# Patient Record
Sex: Female | Born: 1955 | Race: White | Hispanic: No | Marital: Married | Smoking: Never smoker
Health system: Southern US, Community
[De-identification: ages and names within clinical notes are randomized; demographics above are authoritative.]

## PROBLEM LIST (undated history)

## (undated) DIAGNOSIS — D369 Benign neoplasm, unspecified site: Secondary | ICD-10-CM

## (undated) DIAGNOSIS — IMO0002 Reserved for concepts with insufficient information to code with codable children: Secondary | ICD-10-CM

## (undated) DIAGNOSIS — E7800 Pure hypercholesterolemia, unspecified: Secondary | ICD-10-CM

## (undated) DIAGNOSIS — R87610 Atypical squamous cells of undetermined significance on cytologic smear of cervix (ASC-US): Secondary | ICD-10-CM

## (undated) DIAGNOSIS — C801 Malignant (primary) neoplasm, unspecified: Secondary | ICD-10-CM

## (undated) DIAGNOSIS — M8580 Other specified disorders of bone density and structure, unspecified site: Secondary | ICD-10-CM

## (undated) DIAGNOSIS — I10 Essential (primary) hypertension: Secondary | ICD-10-CM

## (undated) DIAGNOSIS — E079 Disorder of thyroid, unspecified: Secondary | ICD-10-CM

## (undated) DIAGNOSIS — Z8619 Personal history of other infectious and parasitic diseases: Secondary | ICD-10-CM

## (undated) HISTORY — PX: BREAST SURGERY: SHX581

## (undated) HISTORY — DX: Disorder of thyroid, unspecified: E07.9

## (undated) HISTORY — PX: OTHER SURGICAL HISTORY: SHX169

## (undated) HISTORY — DX: Pure hypercholesterolemia, unspecified: E78.00

## (undated) HISTORY — DX: Malignant (primary) neoplasm, unspecified: C80.1

## (undated) HISTORY — PX: THYROIDECTOMY, PARTIAL: SHX18

## (undated) HISTORY — PX: MOHS SURGERY: SUR867

## (undated) HISTORY — DX: Personal history of other infectious and parasitic diseases: Z86.19

## (undated) HISTORY — DX: Benign neoplasm, unspecified site: D36.9

## (undated) HISTORY — DX: Essential (primary) hypertension: I10

## (undated) HISTORY — DX: Other specified disorders of bone density and structure, unspecified site: M85.80

## (undated) HISTORY — PX: LAPAROSCOPIC CHOLECYSTECTOMY: SUR755

## (undated) HISTORY — DX: Reserved for concepts with insufficient information to code with codable children: IMO0002

## (undated) HISTORY — DX: Atypical squamous cells of undetermined significance on cytologic smear of cervix (ASC-US): R87.610

---

## 1998-11-14 ENCOUNTER — Other Ambulatory Visit: Admission: RE | Admit: 1998-11-14 | Discharge: 1998-11-14 | Payer: Self-pay | Admitting: Obstetrics and Gynecology

## 1999-04-16 ENCOUNTER — Ambulatory Visit (HOSPITAL_COMMUNITY): Admission: RE | Admit: 1999-04-16 | Discharge: 1999-04-16 | Payer: Self-pay | Admitting: Surgery

## 1999-04-16 ENCOUNTER — Encounter (INDEPENDENT_AMBULATORY_CARE_PROVIDER_SITE_OTHER): Payer: Self-pay | Admitting: *Deleted

## 2000-02-06 ENCOUNTER — Ambulatory Visit (HOSPITAL_COMMUNITY): Admission: RE | Admit: 2000-02-06 | Discharge: 2000-02-06 | Payer: Self-pay | Admitting: Family Medicine

## 2000-02-06 ENCOUNTER — Encounter: Payer: Self-pay | Admitting: Family Medicine

## 2000-02-17 ENCOUNTER — Encounter: Admission: RE | Admit: 2000-02-17 | Discharge: 2000-02-17 | Payer: Self-pay | Admitting: Family Medicine

## 2000-02-17 ENCOUNTER — Encounter: Payer: Self-pay | Admitting: Family Medicine

## 2000-03-09 ENCOUNTER — Ambulatory Visit (HOSPITAL_COMMUNITY): Admission: RE | Admit: 2000-03-09 | Discharge: 2000-03-09 | Payer: Self-pay | Admitting: Surgery

## 2000-03-09 ENCOUNTER — Encounter (INDEPENDENT_AMBULATORY_CARE_PROVIDER_SITE_OTHER): Payer: Self-pay | Admitting: Specialist

## 2000-03-09 ENCOUNTER — Encounter: Payer: Self-pay | Admitting: Surgery

## 2000-04-23 ENCOUNTER — Encounter (INDEPENDENT_AMBULATORY_CARE_PROVIDER_SITE_OTHER): Payer: Self-pay | Admitting: Specialist

## 2000-04-23 ENCOUNTER — Observation Stay (HOSPITAL_COMMUNITY): Admission: RE | Admit: 2000-04-23 | Discharge: 2000-04-24 | Payer: Self-pay | Admitting: Surgery

## 2001-03-19 ENCOUNTER — Other Ambulatory Visit: Admission: RE | Admit: 2001-03-19 | Discharge: 2001-03-19 | Payer: Self-pay | Admitting: Obstetrics and Gynecology

## 2002-02-17 ENCOUNTER — Other Ambulatory Visit: Admission: RE | Admit: 2002-02-17 | Discharge: 2002-02-17 | Payer: Self-pay | Admitting: Dermatology

## 2002-12-27 ENCOUNTER — Other Ambulatory Visit: Admission: RE | Admit: 2002-12-27 | Discharge: 2002-12-27 | Payer: Self-pay | Admitting: Obstetrics and Gynecology

## 2004-04-08 ENCOUNTER — Other Ambulatory Visit: Admission: RE | Admit: 2004-04-08 | Discharge: 2004-04-08 | Payer: Self-pay | Admitting: Obstetrics and Gynecology

## 2005-07-21 ENCOUNTER — Other Ambulatory Visit: Admission: RE | Admit: 2005-07-21 | Discharge: 2005-07-21 | Payer: Self-pay | Admitting: Obstetrics and Gynecology

## 2006-12-03 ENCOUNTER — Other Ambulatory Visit: Admission: RE | Admit: 2006-12-03 | Discharge: 2006-12-03 | Payer: Self-pay | Admitting: Obstetrics and Gynecology

## 2006-12-14 ENCOUNTER — Encounter: Admission: RE | Admit: 2006-12-14 | Discharge: 2006-12-14 | Payer: Self-pay | Admitting: Obstetrics and Gynecology

## 2008-02-03 ENCOUNTER — Other Ambulatory Visit: Admission: RE | Admit: 2008-02-03 | Discharge: 2008-02-03 | Payer: Self-pay | Admitting: Obstetrics and Gynecology

## 2009-02-05 ENCOUNTER — Other Ambulatory Visit: Admission: RE | Admit: 2009-02-05 | Discharge: 2009-02-05 | Payer: Self-pay | Admitting: Obstetrics and Gynecology

## 2009-02-05 ENCOUNTER — Ambulatory Visit: Payer: Self-pay | Admitting: Obstetrics and Gynecology

## 2009-02-05 ENCOUNTER — Encounter: Payer: Self-pay | Admitting: Obstetrics and Gynecology

## 2009-02-12 ENCOUNTER — Ambulatory Visit: Payer: Self-pay | Admitting: Obstetrics and Gynecology

## 2009-12-27 ENCOUNTER — Ambulatory Visit: Payer: Self-pay | Admitting: Obstetrics and Gynecology

## 2010-01-10 ENCOUNTER — Ambulatory Visit: Payer: Self-pay | Admitting: Obstetrics and Gynecology

## 2010-02-12 ENCOUNTER — Ambulatory Visit: Payer: Self-pay | Admitting: Obstetrics and Gynecology

## 2010-02-12 ENCOUNTER — Other Ambulatory Visit: Admission: RE | Admit: 2010-02-12 | Discharge: 2010-02-12 | Payer: Self-pay | Admitting: Obstetrics and Gynecology

## 2010-10-31 ENCOUNTER — Other Ambulatory Visit: Payer: Self-pay | Admitting: Orthopaedic Surgery

## 2010-10-31 ENCOUNTER — Ambulatory Visit
Admission: RE | Admit: 2010-10-31 | Discharge: 2010-10-31 | Disposition: A | Discharge status: Home / Self Care | Payer: Self-pay | Source: Ambulatory Visit | Attending: Orthopaedic Surgery | Admitting: Orthopaedic Surgery

## 2010-10-31 DIAGNOSIS — M542 Cervicalgia: Secondary | ICD-10-CM

## 2010-11-08 NOTE — Op Note (Signed)
[facility]  Patient:    Deborah, Sandoval                   MRN: [id number] Proc. Date: 04/23/00 Adm. Date:  [id number] Attending:  Abigail Miyamoto A                           Operative Report  PREOPERATIVE DIAGNOSIS:  Left thyroid mass.  POSTOPERATIVE DIAGNOSIS:  Left thyroid mass.  PROCEDURE:  Left thyroid lobectomy.  SURGEON:  Abigail Miyamoto, M.D.  ASSISTANT:  Angelia Mould. Derrell Lolling, M.D.  ANESTHESIA:  General endotracheal anesthesia.  ESTIMATED BLOOD LOSS:  Minimal.  INDICATIONS:  Deborah Sandoval is a 55 year old female who was found to have a nodule on the left lobe of her thyroid gland on routine physical examination. Fine needle aspiration showed a follicular component to this mass, and therefore left thyroid lobectomy was recommended.  FINDINGS:  The patient was found to have a very large nodule in the left thyroid lobe.  Frozen section revealed follicular cells but no capsular invasion or findings consistent with carcinoma.  DESCRIPTION OF PROCEDURE:  The patient was brought to the operating room and identified as Deborah Sandoval.  She was placed supine on the operating room table, and general anesthesia was induced.  Her neck was then prepped and draped in the usual sterile fashion.  Using the #10 blade, a transverse incision was made across the lower neck.  The incision was carried down through the platysma with the electrocautery.  Superior and anterior skin flaps were then created with cautery.  The midline was then opened vertically with the electrocautery.  The strap muscles were then dissected and retracted laterally, revealing the underlying left lobe of the thyroid.  The patient was found to have a very large nodule of the mid- and lower pole of the left thyroid lobe.  The lobe was then elevated out and dissection was used as we stayed right on the capsule.  The Kitner and right-angle clamps were used to identify this.  The  _____ vein was identified and clipped twice proximally and once distally and cut with the scissors.  Next, the upper pole vessels were taken down with the right-angle clamp, silk ties, and surgical clips. Again care was taken to stay right on the capsule of the thyroid.  The recurrent laryngeal nerve was identified during the dissection.  The inferior thyroid artery on the right side was identified and clipped and cut as well. Again, the lower pole vessels were also dissected free and clipped with surgical clips and transected as well.  The thyroid gland was then dissected off the trachea with the cautery.  The isthmus was identified and clamped with a hemostat, and cautery was used to completely transect the left lobe of the thyroid gland.  The gland was then sent to pathology for identification.  The isthmus was then tied off with a 3-0 Vicryl suture ligature.  The wound was then thoroughly irrigated.  Hemostasis appeared to be achieved.  A piece of Surgicel was left in the neck on the left side for hemostatic purposes. Again, the nerve was identified and felt to be intact on the left side.  Next, the midline was closed with a running 2-0 Vicryl suture.  The platysma was then closed with interrupted 3-0 Vicryl suture, and the skin was closed with a running 4-0 Monocryl.  Steri-Strips, gauze, and tape were then applied.  Again frozen section  revealed follicular components to the mass but no evidence of carcinoma.  The patient was then extubated in the operating room and taken in stable condition to the recovery room. DD:  04/23/00 TD:  04/23/00 Job: [id number]

## 2011-02-10 ENCOUNTER — Encounter: Payer: Self-pay | Admitting: Obstetrics and Gynecology

## 2011-02-19 DIAGNOSIS — M8580 Other specified disorders of bone density and structure, unspecified site: Secondary | ICD-10-CM | POA: Insufficient documentation

## 2011-02-20 ENCOUNTER — Other Ambulatory Visit: Payer: Self-pay

## 2011-02-20 ENCOUNTER — Ambulatory Visit (INDEPENDENT_AMBULATORY_CARE_PROVIDER_SITE_OTHER): Payer: BC Managed Care – PPO | Admitting: Gynecology

## 2011-02-20 DIAGNOSIS — Z13820 Encounter for screening for osteoporosis: Secondary | ICD-10-CM

## 2011-02-20 DIAGNOSIS — M8580 Other specified disorders of bone density and structure, unspecified site: Secondary | ICD-10-CM

## 2011-03-04 ENCOUNTER — Other Ambulatory Visit (HOSPITAL_COMMUNITY)
Admission: RE | Admit: 2011-03-04 | Discharge: 2011-03-04 | Disposition: A | Discharge status: Home / Self Care | Payer: BC Managed Care – PPO | Source: Ambulatory Visit | Attending: Obstetrics and Gynecology | Admitting: Obstetrics and Gynecology

## 2011-03-04 ENCOUNTER — Ambulatory Visit (INDEPENDENT_AMBULATORY_CARE_PROVIDER_SITE_OTHER): Payer: BC Managed Care – PPO | Admitting: Obstetrics and Gynecology

## 2011-03-04 ENCOUNTER — Encounter: Payer: Self-pay | Admitting: Obstetrics and Gynecology

## 2011-03-04 VITALS — BP 122/78 | Ht 65.5 in | Wt 202.0 lb

## 2011-03-04 DIAGNOSIS — Z01419 Encounter for gynecological examination (general) (routine) without abnormal findings: Secondary | ICD-10-CM

## 2011-03-04 DIAGNOSIS — E7800 Pure hypercholesterolemia, unspecified: Secondary | ICD-10-CM

## 2011-03-04 DIAGNOSIS — Z833 Family history of diabetes mellitus: Secondary | ICD-10-CM

## 2011-03-04 DIAGNOSIS — E041 Nontoxic single thyroid nodule: Secondary | ICD-10-CM

## 2011-03-04 NOTE — Progress Notes (Signed)
Patient came to see me today for her annual GYN exam. She is doing well without menopausal symptoms. She is up-to-date on mammograms and bone densities. She does have elevated cholesterol and we need to recheck a today. The only thing she ate today was coffee with a little bit of cream.  Physical examination: HEENT within normal limits. Neck: Thyroid not large. No masses. Supraclavicular nodes: not enlarged. Breasts: Examined in both sitting midline position. No skin changes and no masses. Abdomen: Soft no guarding rebound or masses or hernia. Pelvic: External: Within normal limits except for 6 mm brown mole on right labia it is not raised, nor does it have any black pigment. BUS: Within normal limits. Vaginal:within normal limits. Good estrogen effect. No evidence of cystocele rectocele or enterocele. Cervix: clean. Uterus: Normal size and shape. Adnexa: No masses. Rectovaginal exam: Confirmatory and negative. Extremities: Within normal limits.  Assessment: 1. Elevated cholesterol 2. Benign mole  Plan: Lipid panel drawn. I showed patient the mole. She knows to return if increase is in size, bleeds, or develops pigmentation of black nature.

## 2011-03-11 ENCOUNTER — Encounter: Payer: Self-pay | Admitting: Obstetrics and Gynecology

## 2012-01-29 ENCOUNTER — Encounter: Payer: Self-pay | Admitting: *Deleted

## 2012-01-29 NOTE — Progress Notes (Signed)
Patient ID: Deborah Sandoval, female   DOB: 01/06/1956, 56 y.o.   MRN: [id number] Pt called requesting referral to see Dr.Mann for colonoscopy, I called Dr.Mann office asking if pt would need one and was told no. Spoke with lisa at office and pt will call and speak with her to schedule screening colonoscopy.

## 2012-02-27 ENCOUNTER — Encounter: Payer: Self-pay | Admitting: Obstetrics and Gynecology

## 2012-03-04 ENCOUNTER — Ambulatory Visit (INDEPENDENT_AMBULATORY_CARE_PROVIDER_SITE_OTHER): Payer: BC Managed Care – PPO | Admitting: Obstetrics and Gynecology

## 2012-03-04 ENCOUNTER — Encounter: Payer: Self-pay | Admitting: Obstetrics and Gynecology

## 2012-03-04 VITALS — BP 120/76 | Ht 65.0 in | Wt 188.0 lb

## 2012-03-04 DIAGNOSIS — E079 Disorder of thyroid, unspecified: Secondary | ICD-10-CM | POA: Insufficient documentation

## 2012-03-04 DIAGNOSIS — Z01419 Encounter for gynecological examination (general) (routine) without abnormal findings: Secondary | ICD-10-CM

## 2012-03-04 DIAGNOSIS — IMO0002 Reserved for concepts with insufficient information to code with codable children: Secondary | ICD-10-CM | POA: Insufficient documentation

## 2012-03-04 DIAGNOSIS — Z8619 Personal history of other infectious and parasitic diseases: Secondary | ICD-10-CM | POA: Insufficient documentation

## 2012-03-04 LAB — CBC WITH DIFFERENTIAL/PLATELET
Eosinophils Absolute: 0.1 10*3/uL (ref 0.0–0.7)
Hemoglobin: 14.2 g/dL (ref 12.0–15.0)
Lymphs Abs: 2 10*3/uL (ref 0.7–4.0)
MCH: 31.3 pg (ref 26.0–34.0)
Neutro Abs: 3.4 10*3/uL (ref 1.7–7.7)
Neutrophils Relative %: 56 % (ref 43–77)
Platelets: 359 10*3/uL (ref 150–400)
RBC: 4.54 MIL/uL (ref 3.87–5.11)
WBC: 6 10*3/uL (ref 4.0–10.5)

## 2012-03-04 LAB — HEMOGLOBIN A1C
Hgb A1c MFr Bld: 5.8 % — ABNORMAL HIGH (ref <5.7)
Mean Plasma Glucose: 120 mg/dL — ABNORMAL HIGH (ref <117)

## 2012-03-04 LAB — LIPID PANEL
HDL: 47 mg/dL (ref >39)
LDL Cholesterol: 159 mg/dL — ABNORMAL HIGH (ref 0–99)

## 2012-03-04 NOTE — Progress Notes (Signed)
Patient came to see me today for her annual GYN exam. She is doing well without hormone replacement. She just had a normal mammogram. Her sister had early onset breast cancer and she does not know her braca  Status. Another sister had colon cancer and she is scheduled colonoscopy. Two years in a row she has had an elevated cholesterol here. We asked to see her PCP but she has not done so yet. She has been on Weight Watchers however. She facets a day. Her total cholesterol both years was over 260 and both years her LDL was over 180. Last year her cholesterol was also elevated. She has had no vaginal bleeding. She has had no pelvic pain. She has never had abnormal Pap smears. Her last Pap smear was 2012. She had a bone density last year which was normal. It was her second bone density.  Physical examination:Kim Julian Reil present. HEENT within normal limits. Neck: Thyroid not large. No masses. Supraclavicular nodes: not enlarged. Breasts: Examined in both sitting and lying  position. No skin changes and no masses. Abdomen: Soft no guarding rebound or masses or hernia. Pelvic: External: Within normal limits. BUS: Within normal limits. Vaginal:within normal limits. Good estrogen effect. No evidence of cystocele rectocele or enterocele. Cervix: clean. Uterus: Normal size and shape. Adnexa: No masses. Rectovaginal exam: Confirmatory and negative. Extremities: Within normal limits.  Assessment: Normal GYN exam. Elevated cholesterol.  Plan: Lipid profile rechecked. If elevated she will see Dr. Kevan Ny. Patient to check to see if sister was checked for BRCA1 and BRCA2 and let me know.The new Pap smear guidelines were discussed with the patient. No pap done.

## 2012-03-04 NOTE — Patient Instructions (Signed)
Check was sister to see if she was screened for BRCA1 and BRCA2. Let me know.

## 2012-03-05 LAB — URINALYSIS W MICROSCOPIC + REFLEX CULTURE
Bacteria, UA: NONE SEEN
Bilirubin Urine: NEGATIVE
Casts: NONE SEEN
Glucose, UA: NEGATIVE mg/dL
Ketones, ur: NEGATIVE mg/dL
Specific Gravity, Urine: 1.021 (ref 1.005–1.030)
Squamous Epithelial / LPF: NONE SEEN

## 2013-03-08 ENCOUNTER — Ambulatory Visit (INDEPENDENT_AMBULATORY_CARE_PROVIDER_SITE_OTHER): Payer: BC Managed Care – PPO | Admitting: Gynecology

## 2013-03-08 ENCOUNTER — Encounter: Payer: Self-pay | Admitting: Gynecology

## 2013-03-08 VITALS — BP 120/78 | Ht 65.0 in | Wt 188.0 lb

## 2013-03-08 DIAGNOSIS — Z13220 Encounter for screening for lipoid disorders: Secondary | ICD-10-CM

## 2013-03-08 DIAGNOSIS — Z01419 Encounter for gynecological examination (general) (routine) without abnormal findings: Secondary | ICD-10-CM

## 2013-03-08 DIAGNOSIS — N952 Postmenopausal atrophic vaginitis: Secondary | ICD-10-CM

## 2013-03-08 LAB — CBC WITH DIFFERENTIAL/PLATELET
Eosinophils Absolute: 0.1 10*3/uL (ref 0.0–0.7)
HCT: 42.4 % (ref 36.0–46.0)
Lymphs Abs: 1.8 10*3/uL (ref 0.7–4.0)
MCH: 31.5 pg (ref 26.0–34.0)
MCHC: 33.7 g/dL (ref 30.0–36.0)
MCV: 93.4 fL (ref 78.0–100.0)
Monocytes Relative: 9 % (ref 3–12)
RDW: 13.4 % (ref 11.5–15.5)

## 2013-03-08 LAB — COMPREHENSIVE METABOLIC PANEL
ALT: 21 U/L (ref 0–35)
Albumin: 4.5 g/dL (ref 3.5–5.2)
CO2: 28 mEq/L (ref 19–32)
Chloride: 103 mEq/L (ref 96–112)
Potassium: 4.3 mEq/L (ref 3.5–5.3)
Sodium: 137 mEq/L (ref 135–145)
Total Bilirubin: 0.5 mg/dL (ref 0.3–1.2)
Total Protein: 7 g/dL (ref 6.0–8.3)

## 2013-03-08 LAB — LIPID PANEL
Cholesterol: 256 mg/dL — ABNORMAL HIGH (ref 0–200)
LDL Cholesterol: 176 mg/dL — ABNORMAL HIGH (ref 0–99)
VLDL: 30 mg/dL (ref 0–40)

## 2013-03-08 NOTE — Patient Instructions (Signed)
Followup in one year for annual exam. Sooner if any issues. 

## 2013-03-08 NOTE — Progress Notes (Signed)
Deborah Sandoval 25-Apr-1956 [id number]        57 y.o.  [id number] for annual exam.  Former patient of Dr. Eda Paschal. Doing well without complaints.  Past medical history,surgical history, medications, allergies, family history and social history were all reviewed and documented in the EPIC chart.  ROS:  Performed and pertinent positives and negatives are included in the history, assessment and plan .  Exam: Kim assistant Filed Vitals:   03/08/13 0905  BP: 120/78  Height: 5\' 5"  (1.651 m)  Weight: 188 lb (85.276 kg)   General appearance  Normal Skin grossly normal Head/Neck normal with no cervical or supraclavicular adenopathy thyroid normal Lungs  clear Cardiac RR, without RMG Abdominal  soft, nontender, without masses, organomegaly or hernia Breasts  examined lying and sitting without masses, retractions, discharge or axillary adenopathy. Bilateral reduction scars noted. Pelvic  Ext/BUS/vagina  normal with mild atrophic changes  Cervix  normal  Uterus  anteverted, normal size, shape and contour, midline and mobile nontender   Adnexa  Without masses or tenderness    Anus and perineum  normal   Rectovaginal  normal sphincter tone without palpated masses or tenderness.    Assessment/Plan:  57 y.o. [name] female for annual exam.   1. Postmenopausal. Without significant hot flashes, vaginal dryness or dyspareunia. No vaginal bleeding. We'll continue to monitor. Patient knows to report any vaginal bleeding. 2. Bone density. Patient was labeled as osteopenic with T score -1.1 with DEXA 2010. Followup DEXA 2012 normal. Will not label as osteopenic. Recommend repeat DEXA at five-year interval. Check vitamin D level today. Increase calcium vitamin D discussed. 3. Pap smear 2012. No Pap smear done today. No history of abnormal Pap smears. Plan repeat next year a 3 year interval. 4. Mammography 02/2012. Patient to repeat this fall. SBE monthly reviewed. 5. Colonoscopy 2013. Repeat at their  recommended interval. 6. Health maintenance. Baseline CBC comprehensive metabolic panel lipid profile urinalysis TSH and vitamin D level ordered. Has had elevated cholesterol in the past. Is in the process of arranging primary physician appointment and will take her labs to them for review and followup.  Note: This document was prepared with digital dictation and possible smart phrase technology. Any transcriptional errors that result from this process are unintentional.   Dara Lords MD, 9:27 AM 03/08/2013

## 2013-03-09 LAB — URINALYSIS W MICROSCOPIC + REFLEX CULTURE
Bilirubin Urine: NEGATIVE
Crystals: NONE SEEN
Nitrite: NEGATIVE
Specific Gravity, Urine: 1.016 (ref 1.005–1.030)
Squamous Epithelial / LPF: NONE SEEN
Urobilinogen, UA: 0.2 mg/dL (ref 0.0–1.0)
pH: 6 (ref 5.0–8.0)

## 2013-03-17 ENCOUNTER — Encounter: Payer: Self-pay | Admitting: Gynecology

## 2013-04-28 ENCOUNTER — Other Ambulatory Visit: Payer: Self-pay

## 2013-10-11 ENCOUNTER — Encounter: Payer: Self-pay | Admitting: Gynecology

## 2013-10-11 ENCOUNTER — Ambulatory Visit (INDEPENDENT_AMBULATORY_CARE_PROVIDER_SITE_OTHER): Payer: 59 | Admitting: Gynecology

## 2013-10-11 DIAGNOSIS — N390 Urinary tract infection, site not specified: Secondary | ICD-10-CM

## 2013-10-11 DIAGNOSIS — R300 Dysuria: Secondary | ICD-10-CM

## 2013-10-11 DIAGNOSIS — R319 Hematuria, unspecified: Secondary | ICD-10-CM

## 2013-10-11 LAB — URINALYSIS W MICROSCOPIC + REFLEX CULTURE
BILIRUBIN URINE: NEGATIVE
Casts: NONE SEEN
Crystals: NONE SEEN
Glucose, UA: NEGATIVE mg/dL
KETONES UR: NEGATIVE mg/dL
Leukocytes, UA: NEGATIVE
NITRITE: NEGATIVE
PROTEIN: NEGATIVE mg/dL
SPECIFIC GRAVITY, URINE: 1.02 (ref 1.005–1.030)
UROBILINOGEN UA: 0.2 mg/dL (ref 0.0–1.0)
WBC, UA: NONE SEEN WBC/hpf (ref <3)
pH: 5 (ref 5.0–8.0)

## 2013-10-11 MED ORDER — NITROFURANTOIN MONOHYD MACRO 100 MG PO CAPS: 100.0000 mg | ORAL_CAPSULE | Freq: Two times a day (BID) | ORAL | Status: DC

## 2013-10-11 NOTE — Progress Notes (Signed)
Deborah Sandoval [birth year] [id number]        57 y.o.  [id number] presents complaining of frequency dysuria starting last week. Used over-the-counter AZO with some relief but then her symptoms have returned which she is going more frequently with mild dysuria. No back pain fever chills nausea vomiting. No pus or blood in the urine.  Past medical history,surgical history, problem list, medications, allergies, family history and social history were all reviewed and documented in the EPIC chart.  Directed ROS with pertinent positives and negatives documented in the history of present illness/assessment and plan.  Exam: General appearance  Normal Spine straight without CVA tenderness Abdomen soft nontender without masses guarding rebound organomegaly.  Assessment/Plan:  58 y.o. [id number] with symptoms to suggest UTI. Urinalysis shows TNTC RBC no significant wbc rare bacteria. Will treat as UTI with Macrobid 100 mg twice a day x7 days. Given the predominance of the hematuria I recommended that the patient repeat a clean-catch urinalysis in several weeks after treatment just to make sure the hematuria clears. I discussed possibilities to include tumor and possible referral to urology if her hematuria continues. Patient knows the importance of followup.   Note: This document was prepared with digital dictation and possible smart phrase technology. Any transcriptional errors that result from this process are unintentional.   [name], 11:48 AM 10/11/2013

## 2013-10-11 NOTE — Patient Instructions (Addendum)
Take antibiotic twice daily for 7 days. Followup if symptoms persist, worsen or recur. Regardless followup in several weeks to recheck a urinalysis after your symptoms are better to make sure the microscopic blood in your urine clears. If it does not then I may send you to see a urologist.Urinary Tract Infection Urinary tract infections (UTIs) can develop anywhere along your urinary tract. Your urinary tract is your body's drainage system for removing wastes and extra water. Your urinary tract includes two kidneys, two ureters, a bladder, and a urethra. Your kidneys are a pair of bean-shaped organs. Each kidney is about the size of your fist. They are located below your ribs, one on each side of your spine. CAUSES Infections are caused by microbes, which are microscopic organisms, including fungi, viruses, and bacteria. These organisms are so small that they can only be seen through a microscope. Bacteria are the microbes that most commonly cause UTIs. SYMPTOMS  Symptoms of UTIs may vary by age and gender of the patient and by the location of the infection. Symptoms in young women typically include a frequent and intense urge to urinate and a painful, burning feeling in the bladder or urethra during urination. Older women and men are more likely to be tired, shaky, and weak and have muscle aches and abdominal pain. A fever may mean the infection is in your kidneys. Other symptoms of a kidney infection include pain in your back or sides below the ribs, nausea, and vomiting. DIAGNOSIS To diagnose a UTI, your caregiver will ask you about your symptoms. Your caregiver also will ask to provide a urine sample. The urine sample will be tested for bacteria and white blood cells. White blood cells are made by your body to help fight infection. TREATMENT  Typically, UTIs can be treated with medication. Because most UTIs are caused by a bacterial infection, they usually can be treated with the use of antibiotics. The  choice of antibiotic and length of treatment depend on your symptoms and the type of bacteria causing your infection. HOME CARE INSTRUCTIONS  If you were prescribed antibiotics, take them exactly as your caregiver instructs you. Finish the medication even if you feel better after you have only taken some of the medication.  Drink enough water and fluids to keep your urine clear or pale yellow.  Avoid caffeine, tea, and carbonated beverages. They tend to irritate your bladder.  Empty your bladder often. Avoid holding urine for long periods of time.  Empty your bladder before and after sexual intercourse.  After a bowel movement, women should cleanse from front to back. Use each tissue only once. SEEK MEDICAL CARE IF:   You have back pain.  You develop a fever.  Your symptoms do not begin to resolve within 3 days. SEEK IMMEDIATE MEDICAL CARE IF:   You have severe back pain or lower abdominal pain.  You develop chills.  You have nausea or vomiting.  You have continued burning or discomfort with urination. MAKE SURE YOU:   Understand these instructions.  Will watch your condition.  Will get help right away if you are not doing well or get worse. Document Released: 03/19/2005 Document Revised: 12/09/2011 Document Reviewed: 07/18/2011 [facility] Patient Information 2014 [place].

## 2013-10-12 LAB — URINE CULTURE
Colony Count: NO GROWTH
Organism ID, Bacteria: NO GROWTH

## 2013-10-19 ENCOUNTER — Encounter: Payer: Self-pay | Admitting: Gynecology

## 2013-10-19 ENCOUNTER — Ambulatory Visit (INDEPENDENT_AMBULATORY_CARE_PROVIDER_SITE_OTHER): Payer: 59 | Admitting: Gynecology

## 2013-10-19 DIAGNOSIS — R3915 Urgency of urination: Secondary | ICD-10-CM

## 2013-10-19 LAB — URINALYSIS W MICROSCOPIC + REFLEX CULTURE
BILIRUBIN URINE: NEGATIVE
GLUCOSE, UA: NEGATIVE mg/dL
Hgb urine dipstick: NEGATIVE
KETONES UR: NEGATIVE mg/dL
Leukocytes, UA: NEGATIVE
Nitrite: NEGATIVE
PROTEIN: NEGATIVE mg/dL
Specific Gravity, Urine: 1.01 (ref 1.005–1.030)
UROBILINOGEN UA: 0.2 mg/dL (ref 0.0–1.0)
pH: 5.5 (ref 5.0–8.0)

## 2013-10-19 NOTE — Patient Instructions (Signed)
Followup for ultrasound as scheduled. Office will call you to arrange appointment with urology.

## 2013-10-19 NOTE — Progress Notes (Signed)
Deborah Sandoval 05-20-1956 [id number]        57 y.o.  [id number] presents complaining of continued urgency. Was treated for UTI with Macrobid. Had hematuria on urinalysis. Urine culture ultimately did not grow out bacteria. Stated that her discomfort seemed to get better but she still is having a lot of urgency waking her up at night. Feels like something is pushing down on her bladder. No fever chills nausea vomiting diarrhea constipation.  Past medical history,surgical history, problem list, medications, allergies, family history and social history were all reviewed and documented in the EPIC chart.  Directed ROS with pertinent positives and negatives documented in the history of present illness/assessment and plan.  Exam: Kim assistant General appearance  Normal Abdomen soft nontender without masses guarding rebound organomegaly. Pelvic external BUS vagina normal. Cervix normal. Uterus normal size midline mobile nontender. Adnexa without masses or tenderness. Rectovaginal exam is normal.  Assessment/Plan:  58 y.o. [id number] continued urgency with prior hematuria. Treated UTI although culture was negative. Repeat urinalysis today clear with no evidence of hematuria white cells or bacteria. Due to feeling like something is pressing down we'll schedule ultrasound rule out nonpalpable abnormalities. Will also schedule appointment with urology regarding this rule out bladder pathology/stone.   Note: This document was prepared with digital dictation and possible smart phrase technology. Any transcriptional errors that result from this process are unintentional.   [name], 11:35 AM 10/19/2013

## 2013-10-20 ENCOUNTER — Telehealth: Payer: Self-pay | Admitting: *Deleted

## 2013-10-20 NOTE — Telephone Encounter (Signed)
Appointment 11/02/13 @ 10:00 am

## 2013-10-20 NOTE — Telephone Encounter (Signed)
Message copied by Thamas Jaegers on Thu Oct 20, 2013  9:04 AM ------      Message from: Anastasio Auerbach      Created: Wed Oct 19, 2013 11:34 AM       Schedule appointment with urology reference new onset urgency  and hematuria. Treated for UTI despite negative culture and symptoms have continued. ------

## 2013-10-20 NOTE — Telephone Encounter (Signed)
Left message to call pt will need to get referral from PCP before she can be seen.

## 2013-10-20 NOTE — Telephone Encounter (Signed)
Pt informed with all the below note. 

## 2013-10-24 ENCOUNTER — Other Ambulatory Visit: Payer: 59

## 2013-10-26 ENCOUNTER — Encounter: Payer: Self-pay | Admitting: Gynecology

## 2013-10-26 ENCOUNTER — Other Ambulatory Visit: Payer: Self-pay | Admitting: Gynecology

## 2013-10-26 ENCOUNTER — Ambulatory Visit (INDEPENDENT_AMBULATORY_CARE_PROVIDER_SITE_OTHER): Payer: 59 | Admitting: Gynecology

## 2013-10-26 ENCOUNTER — Ambulatory Visit (INDEPENDENT_AMBULATORY_CARE_PROVIDER_SITE_OTHER): Payer: 59

## 2013-10-26 DIAGNOSIS — R102 Pelvic and perineal pain: Secondary | ICD-10-CM

## 2013-10-26 DIAGNOSIS — N9489 Other specified conditions associated with female genital organs and menstrual cycle: Secondary | ICD-10-CM

## 2013-10-26 DIAGNOSIS — R3915 Urgency of urination: Secondary | ICD-10-CM

## 2013-10-26 DIAGNOSIS — N3289 Other specified disorders of bladder: Secondary | ICD-10-CM

## 2013-10-26 NOTE — Progress Notes (Signed)
Deborah Sandoval 1955-11-02 [id number]        57 y.o.  [id number] presents for ultrasound. History of pressure symptoms and bladder symptoms. Had hematuria previously was treated for UTI. Followup urinalysis was negative. Still is having bladder pressure symptoms we ordered the ultrasound. She actually felt that her symptoms seem to have gotten better but last night she started to feel more pressure symptoms now.  Past medical history,surgical history, problem list, medications, allergies, family history and social history were all reviewed and documented in the EPIC chart.  Directed ROS with pertinent positives and negatives documented in the history of present illness/assessment and plan.  Ultrasound shows uterus normal size. Endometrial echo 5.6 mm. Right and left ovaries normal. Cul-de-sac negative.  Assessment/Plan:  58 y.o. [id number] with bladder type symptoms. Physical exam was normal from a pelvic standpoint previously. Ultrasound is negative. Recheck urinalysis today. Patient has an appointment to see a urologist next week and will followup for this.   Note: This document was prepared with digital dictation and possible smart phrase technology. Any transcriptional errors that result from this process are unintentional.   [name], 11:29 AM 10/26/2013

## 2013-10-26 NOTE — Patient Instructions (Signed)
Followup with your urology appointment as scheduled.

## 2013-10-29 ENCOUNTER — Emergency Department (HOSPITAL_BASED_OUTPATIENT_CLINIC_OR_DEPARTMENT_OTHER)
Admission: EM | Admit: 2013-10-29 | Discharge: 2013-10-29 | Disposition: A | Discharge status: Home / Self Care | Payer: 59 | Attending: Emergency Medicine | Admitting: Emergency Medicine

## 2013-10-29 ENCOUNTER — Encounter (HOSPITAL_BASED_OUTPATIENT_CLINIC_OR_DEPARTMENT_OTHER): Payer: Self-pay | Admitting: Emergency Medicine

## 2013-10-29 ENCOUNTER — Emergency Department (HOSPITAL_BASED_OUTPATIENT_CLINIC_OR_DEPARTMENT_OTHER): Payer: 59

## 2013-10-29 DIAGNOSIS — Z79899 Other long term (current) drug therapy: Secondary | ICD-10-CM | POA: Insufficient documentation

## 2013-10-29 DIAGNOSIS — R3915 Urgency of urination: Secondary | ICD-10-CM | POA: Insufficient documentation

## 2013-10-29 DIAGNOSIS — Z8639 Personal history of other endocrine, nutritional and metabolic disease: Secondary | ICD-10-CM | POA: Insufficient documentation

## 2013-10-29 DIAGNOSIS — R109 Unspecified abdominal pain: Secondary | ICD-10-CM | POA: Insufficient documentation

## 2013-10-29 DIAGNOSIS — Z9089 Acquired absence of other organs: Secondary | ICD-10-CM | POA: Insufficient documentation

## 2013-10-29 DIAGNOSIS — IMO0002 Reserved for concepts with insufficient information to code with codable children: Secondary | ICD-10-CM | POA: Insufficient documentation

## 2013-10-29 DIAGNOSIS — Z862 Personal history of diseases of the blood and blood-forming organs and certain disorders involving the immune mechanism: Secondary | ICD-10-CM | POA: Insufficient documentation

## 2013-10-29 DIAGNOSIS — N3289 Other specified disorders of bladder: Secondary | ICD-10-CM | POA: Insufficient documentation

## 2013-10-29 DIAGNOSIS — R319 Hematuria, unspecified: Secondary | ICD-10-CM

## 2013-10-29 DIAGNOSIS — Z8619 Personal history of other infectious and parasitic diseases: Secondary | ICD-10-CM | POA: Insufficient documentation

## 2013-10-29 LAB — URINE MICROSCOPIC-ADD ON

## 2013-10-29 LAB — URINALYSIS, ROUTINE W REFLEX MICROSCOPIC
Bilirubin Urine: NEGATIVE
GLUCOSE, UA: NEGATIVE mg/dL
KETONES UR: 15 mg/dL — AB
Leukocytes, UA: NEGATIVE
Nitrite: NEGATIVE
Protein, ur: NEGATIVE mg/dL
Specific Gravity, Urine: 1.022 (ref 1.005–1.030)
Urobilinogen, UA: 1 mg/dL (ref 0.0–1.0)
pH: 6 (ref 5.0–8.0)

## 2013-10-29 MED ORDER — OXYBUTYNIN CHLORIDE ER 10 MG PO TB24: 10.0000 mg | ORAL_TABLET | Freq: Every day | ORAL | Status: DC

## 2013-10-29 NOTE — Discharge Instructions (Signed)
Hematuria, Adult °Hematuria is blood in your urine. It can be caused by a bladder infection, kidney infection, prostate infection, kidney stone, or cancer of your urinary tract. Infections can usually be treated with medicine, and a kidney stone usually will pass through your urine. If neither of these is the cause of your hematuria, further workup to find out the reason may be needed. °It is very important that you tell your health care provider about any blood you see in your urine, even if the blood stops without treatment or happens without causing pain. Blood in your urine that happens and then stops and then happens again can be a symptom of a very serious condition. Also, pain is not a symptom in the initial stages of many urinary cancers. °HOME CARE INSTRUCTIONS  °· Drink lots of fluid, 3 4 quarts a day. If you have been diagnosed with an infection, cranberry juice is especially recommended, in addition to large amounts of water. °· Avoid caffeine, tea, and carbonated beverages, because they tend to irritate the bladder. °· Avoid alcohol because it may irritate the prostate. °· Only take over-the-counter or prescription medicines for pain, discomfort, or fever as directed by your health care provider. °· If you have been diagnosed with a kidney stone, follow your health care provider's instructions regarding straining your urine to catch the stone. °· Empty your bladder often. Avoid holding urine for long periods of time. °· After a bowel movement, women should cleanse front to back. Use each tissue only once. °· Empty your bladder before and after sexual intercourse if you are a female. °SEEK MEDICAL CARE IF: °You develop back pain, fever, a feeling of sickness in your stomach (nausea), or vomiting or if your symptoms are not better in 3 days. Return sooner if you are getting worse. °SEEK IMMEDIATE MEDICAL CARE IF:  °· You have a persistent fever, with a temperature of 101.8°F (38.8°C) or greater. °· You  develop severe vomiting and are unable to keep the medicine down. °· You develop severe back or abdominal pain despite taking your medicines. °· You begin passing a large amount of blood or clots in your urine. °· You feel extremely weak or faint, or you pass out. °MAKE SURE YOU:  °· Understand these instructions. °· Will watch your condition. °· Will get help right away if you are not doing well or get worse. °Document Released: 06/09/2005 Document Revised: 03/30/2013 Document Reviewed: 02/07/2013 °[employer]® Patient Information ©2014 [employer]. ° °

## 2013-10-29 NOTE — ED Notes (Signed)
Pt reports bilat. Flank pain > L than right hx recent UTI tx with antibiotics

## 2013-10-29 NOTE — ED Provider Notes (Signed)
CSN: [id number]     Arrival date & time 10/29/13  2036 History  This chart was scribed for Olvin Rohr B. Karle Starch, MD by Rolanda Lundborg, ED Scribe. This patient was seen in room MH10/MH10 and the patient's care was started at 9:46 PM.     Chief Complaint  Patient presents with  . Back Pain   The history is provided by the patient. No language interpreter was used.   HPI Comments: Deborah Sandoval is a 58 y.o. female who presents to the Emergency Department complaining of bilateral flank pain, left worse than right, onset today with associated urinary urgency. Moving around makes the pain worse. She took 800mg  ibuprofen hours ago with moderate relief. She has had intermittent urinary urgency for the past 3 weeks. She was diagnosed with a UTI with hematuria 3 weeks ago and finished a 7 day course of antibiotics. Her symptoms returned so she was seen by her OB and had a negative UA and negative pelvic US. The urgency returned again tonight. She denies fevers.    Past Medical History  Diagnosis Date  . H/O: vasectomy     HUSBAND W VASECTOMY  . Adenoma     ON THYROID--REMOVED  . Herniated disc   . History of shingles   . Thyroid disease     Nodule   Past Surgical History  Procedure Laterality Date  . Laparoscopic cholecystectomy    . Breast surgery      REDUCTION MAMMAPLASTY  . Cesarean section  1991   Family History  Problem Relation Age of Onset  . Cancer Father     LUNG  . Breast cancer Sister     Age 81  . Diabetes Maternal Uncle   . Cancer Mother     Sarcoma  . Colon cancer Sister    History  Substance Use Topics  . Smoking status: Never Smoker   . Smokeless tobacco: Never Used  . Alcohol Use: No   OB History   Grav Para Term Preterm Abortions TAB SAB Ect Mult Living   6 3 3  3     3      Review of Systems  Constitutional: Negative for fever.  Genitourinary: Positive for urgency and flank pain.   A complete 10 system review of systems was obtained and all systems  are negative except as noted in the HPI and PMH.     Allergies  Review of patient's allergies indicates no known allergies.  Home Medications   Prior to Admission medications   Medication Sig Start Date End Date Taking? Authorizing Provider  Calcium Carbonate-Vitamin D (CALCIUM + D PO) Take by mouth.      Historical Provider, MD  nitrofurantoin, macrocrystal-monohydrate, (MACROBID) 100 MG capsule Take 1 capsule (100 mg total) by mouth 2 (two) times daily. For 7 days 10/11/13   Anastasio Auerbach, MD   BP 180/93  Pulse 76  Temp(Src) 97.9 F (36.6 C) (Oral)  Resp 22  Ht 5\' 5"  (1.651 m)  Wt 190 lb (86.183 kg)  BMI 31.62 kg/m2  SpO2 97% Physical Exam  Nursing note and vitals reviewed. Constitutional: She is oriented to person, place, and time. She appears well-developed and well-nourished.  HENT:  Head: Normocephalic and atraumatic.  Eyes: EOM are normal. Pupils are equal, round, and reactive to light.  Neck: Normal range of motion. Neck supple.  Cardiovascular: Normal rate, normal heart sounds and intact distal pulses.   Pulmonary/Chest: Effort normal and breath sounds normal.  Abdominal: Bowel sounds  are normal. She exhibits no distension. There is no tenderness.  Genitourinary:  No CVA tenderness  Musculoskeletal: Normal range of motion. She exhibits no edema and no tenderness.  Neurological: She is alert and oriented to person, place, and time. She has normal strength. No cranial nerve deficit or sensory deficit.  Skin: Skin is warm and dry. No rash noted.  Psychiatric: She has a normal mood and affect.    ED Course  Procedures (including critical care time) Medications - No data to display  DIAGNOSTIC STUDIES: Oxygen Saturation is 97% on RA, normal by my interpretation.    COORDINATION OF CARE: 9:52 PM- Discussed treatment plan with pt which includes CT abdomen pelvis and UA. Pt agrees to plan.    Labs Review Labs Reviewed  URINALYSIS, ROUTINE W REFLEX  MICROSCOPIC - Abnormal; Notable for the following:    Hgb urine dipstick MODERATE (*)    Ketones, ur 15 (*)    All other components within normal limits  URINE MICROSCOPIC-ADD ON - Abnormal; Notable for the following:    Bacteria, UA FEW (*)    All other components within normal limits    Imaging Review Ct Abdomen Pelvis Wo Contrast  10/29/2013   CLINICAL DATA:  Bilateral flank pain. Left worse than right flank pain. Urinary urgency.  EXAM: CT ABDOMEN AND PELVIS WITHOUT CONTRAST  TECHNIQUE: Multidetector CT imaging of the abdomen and pelvis was performed following the standard protocol without IV contrast.  COMPARISON:  No comparisons  FINDINGS: Bones: L5-S1 predominant lumbar spondylosis. No aggressive osseous lesions. Thoracic spondylosis. Bilateral SI joint degenerative disease.  Lung Bases: Clear.  Liver: Unenhanced CT was performed per clinician order. Lack of IV contrast limits sensitivity and specificity, especially for evaluation of abdominal/pelvic solid viscera. Grossly normal.  Spleen:  Normal.  Gallbladder:  Cholecystectomy.  Common bile duct:  Normal.  Pancreas:  Normal.  Adrenal glands:  Normal bilaterally.  Kidneys: Normal. No calculi or hydronephrosis. Both ureters appear within normal limits.  Stomach:  Normal.  Small bowel: Normal. No mesenteric adenopathy. No obstruction or inflammatory changes.  Colon: Normal appendix. Moderate stool burden. Redundant transverse colon. Distal colon is decompressed. Prominent stool is present in the rectum.  Pelvic Genitourinary: Normal appearance of the uterus and adnexa. Urinary bladder appears normal. Phleboliths incidentally noted.  Vasculature: Minimal atherosclerosis.  No acute abnormality.  Body Wall: Tiny fat containing periumbilical hernia.  IMPRESSION: 1. No acute abnormality. 2. Moderate stool burden.   Electronically Signed   By: Dereck Ligas M.D.   On: 10/29/2013 22:20     EKG Interpretation None      MDM   Final diagnoses:   Hematuria  Bladder spasm    UA neg for infection, but does have hematuria. CT neg for stones. Unclear etiology of symptoms, but feeling much better now. Advised Urology followup. Will start ditropan for bladder spasm.   I personally performed the services described in this documentation, which was scribed in my presence. The recorded information has been reviewed and is accurate.      Jona Erkkila B. Karle Starch, MD 10/29/13 [phone]

## 2014-04-24 ENCOUNTER — Encounter (HOSPITAL_BASED_OUTPATIENT_CLINIC_OR_DEPARTMENT_OTHER): Payer: Self-pay | Admitting: Emergency Medicine

## 2014-09-08 ENCOUNTER — Encounter: Payer: Self-pay | Admitting: Gynecology

## 2014-09-08 ENCOUNTER — Ambulatory Visit (INDEPENDENT_AMBULATORY_CARE_PROVIDER_SITE_OTHER): Payer: 59 | Admitting: Gynecology

## 2014-09-08 VITALS — BP 120/76

## 2014-09-08 DIAGNOSIS — N63 Unspecified lump in unspecified breast: Secondary | ICD-10-CM

## 2014-09-08 NOTE — Progress Notes (Signed)
Deborah Sandoval 12-Jun-1956 [id number]        59 y.o.  [id number] Presents complaining of feeling a lump in her right breast on and off over the last several months. At one point she'll feel at other times she won't. No tenderness discharge or other symptoms. Is overdue for her mammogram/annual exam.  Past medical history,surgical history, problem list, medications, allergies, family history and social history were all reviewed and documented in the EPIC chart.  Directed ROS with pertinent positives and negatives documented in the history of present illness/assessment and plan.  Exam: Kim assistant Filed Vitals:   09/08/14 1212  BP: 120/76   General appearance:  Normal Both breast examined lying and sitting. No masses, retractions, discharge, adenopathy palpated. Area patient is pointing to is at 2 to 3:00 right breast along sternal border. No clear palpable masses in this area.  Assessment/Plan:  59 y.o. [id number] with perceived right breast mass. Exam by physician is negative. Will start with diagnostic mammography and ultrasound over this area. If negative will plan expected management with follow up exam if patient still feels any abnormalities. If otherwise then will triage based on results.     [name], 12:24 PM 09/08/2014

## 2014-09-08 NOTE — Patient Instructions (Signed)
Office will call you to arrange mammogram and ultrasound. Follow up for your annual exam as scheduled.

## 2014-09-11 ENCOUNTER — Telehealth: Payer: Self-pay | Admitting: *Deleted

## 2014-09-11 NOTE — Telephone Encounter (Signed)
Appointment on 09/19/14 @ 10:15am at [facility] pt informed.

## 2014-09-11 NOTE — Telephone Encounter (Signed)
-----   Message from Anastasio Auerbach, MD sent at 09/08/2014 12:23 PM EDT ----- Schedule diagnostic mammogram and ultrasound at [facility] reference patient felt lump right breast 2 to 3:00 position along sternal border. Physician exam is negative.

## 2014-09-20 ENCOUNTER — Encounter: Payer: Self-pay | Admitting: Gynecology

## 2014-10-27 ENCOUNTER — Encounter: Payer: Self-pay | Admitting: Gynecology

## 2014-10-27 ENCOUNTER — Ambulatory Visit (INDEPENDENT_AMBULATORY_CARE_PROVIDER_SITE_OTHER): Payer: 59 | Admitting: Gynecology

## 2014-10-27 ENCOUNTER — Other Ambulatory Visit (HOSPITAL_COMMUNITY)
Admission: RE | Admit: 2014-10-27 | Discharge: 2014-10-27 | Disposition: A | Discharge status: Home / Self Care | Payer: 59 | Source: Ambulatory Visit | Attending: Gynecology | Admitting: Gynecology

## 2014-10-27 VITALS — BP 120/76 | Ht 66.0 in | Wt 192.0 lb

## 2014-10-27 DIAGNOSIS — N952 Postmenopausal atrophic vaginitis: Secondary | ICD-10-CM

## 2014-10-27 DIAGNOSIS — Z01419 Encounter for gynecological examination (general) (routine) without abnormal findings: Secondary | ICD-10-CM | POA: Diagnosis not present

## 2014-10-27 DIAGNOSIS — N814 Uterovaginal prolapse, unspecified: Secondary | ICD-10-CM

## 2014-10-27 NOTE — Progress Notes (Signed)
Deborah Sandoval 1956-06-08 [id number]        58 y.o.  [name] for annual exam.  Several issues noted below.  Past medical history,surgical history, problem list, medications, allergies, family history and social history were all reviewed and documented as reviewed in the EPIC chart.  ROS:  Performed with pertinent positives and negatives included in the history, assessment and plan.   Additional significant findings :  none   Exam: Deborah Sandoval Vitals:   10/27/14 0958  BP: 120/76  Height: 5\' 6"  (1.676 m)  Weight: 192 lb (87.091 kg)   General appearance:  Normal affect, orientation and appearance. Skin: Grossly normal HEENT: Without gross lesions.  No cervical or supraclavicular adenopathy. Thyroid normal.  Lungs:  Clear without wheezing, rales or rhonchi Cardiac: RR, without RMG Abdominal:  Soft, nontender, without masses, guarding, rebound, organomegaly or hernia Breasts:  Examined lying and sitting without masses, retractions, discharge or axillary adenopathy.  Well-healed bilateral reduction scars Pelvic:  Ext/BUS/vagina normal with atrophic changes. First degree uterine prolapse to within 2-3 fingerbreadths of the introitus  Cervix normal with atrophic changes. Pap smear done  Uterus anteverted, normal size, shape and contour, midline and mobile nontender   Adnexa  Without masses or tenderness    Anus and perineum  Normal   Rectovaginal  Normal sphincter tone without palpated masses or tenderness.    Assessment/Plan:  59 y.o. [name] female for annual exam.   1. Postmenopausal/atrophic genital changes. Patient doing well without significant hot flushes, night sweats, vaginal dryness. No vaginal bleeding. Continue to monitor and report any issues. 2. Uterine prolapse. Uterus to within 2-3 finger breaths of the introitus. Remembers being told by Dr. Cherylann Banas she did have some prolapse. Bladder otherwise appears well supported as does rectum. Options to include  observation, pessary and surgery discussed. Patient does not appear overly symptomatic. She'll monitor for pressure or discomfort symptoms and will follow up if any issues. 3. Osteopenia. DEXA previously with T score -1.1. Most recent DEXA 2012 normal. Will plan repeat DEXA at age 16 which will be a 6 year interval. Increase calcium vitamin D reviewed. 4. Recent mammography 08/2014 secondary to a perceived right breast thickening by the patient. Physicians exam was normal. Her breast exam remains normal today and patient does not feel that this area is is prominent. She also had an ultrasound over this area all of which was negative. We'll plan on expected management with SBE and follow up if she perceives any masses. Of note the report is of the right breast but the patient states she had a bilateral mammography was told to repeat it in a year. Will follow up with the diagnostic center for a complete report. 5. Pap smear 2012. Pap smear done today. No history of significant abnormal Pap smears. 6. Colonoscopy 2014. Repeat at their recommended interval. 7. Health maintenance. Patient reports routine blood work done through her primary physician. Follow up in one year, sooner as needed.     [name], 10:25 AM 10/27/2014

## 2014-10-27 NOTE — Addendum Note (Signed)
Addended by: Nelva Nay on: 10/27/2014 10:39 AM   Modules accepted: Orders

## 2014-10-27 NOTE — Patient Instructions (Signed)
You may obtain a copy of any labs that were done today by logging onto MyChart as outlined in the instructions provided with your AVS (after visit summary). The office will not call with normal lab results but certainly if there are any significant abnormalities then we will contact you.   Health Maintenance, Female A healthy lifestyle and preventative care can promote health and wellness.  Maintain regular health, dental, and eye exams.  Eat a healthy diet. Foods like vegetables, fruits, whole grains, low-fat dairy products, and lean protein foods contain the nutrients you need without too many calories. Decrease your intake of foods high in solid fats, added sugars, and salt. Get information about a proper diet from your caregiver, if necessary.  Regular physical exercise is one of the most important things you can do for your health. Most adults should get at least 150 minutes of moderate-intensity exercise (any activity that increases your heart rate and causes you to sweat) each week. In addition, most adults need muscle-strengthening exercises on 2 or more days a week.   Maintain a healthy weight. The body mass index (BMI) is a screening tool to identify possible weight problems. It provides an estimate of body fat based on height and weight. Your caregiver can help determine your BMI, and can help you achieve or maintain a healthy weight. For adults 20 years and older:  A BMI below 18.5 is considered underweight.  A BMI of 18.5 to 24.9 is normal.  A BMI of 25 to 29.9 is considered overweight.  A BMI of 30 and above is considered obese.  Maintain normal blood lipids and cholesterol by exercising and minimizing your intake of saturated fat. Eat a balanced diet with plenty of fruits and vegetables. Blood tests for lipids and cholesterol should begin at age 61 and be repeated every 5 years. If your lipid or cholesterol levels are high, you are over 50, or you are a high risk for heart  disease, you may need your cholesterol levels checked more frequently.Ongoing high lipid and cholesterol levels should be treated with medicines if diet and exercise are not effective.  If you smoke, find out from your caregiver how to quit. If you do not use tobacco, do not start.  Lung cancer screening is recommended for adults aged 33 80 years who are at high risk for developing lung cancer because of a history of smoking. Yearly low-dose computed tomography (CT) is recommended for people who have at least a 30-pack-year history of smoking and are a current smoker or have quit within the past 15 years. A pack year of smoking is smoking an average of 1 pack of cigarettes a day for 1 year (for example: 1 pack a day for 30 years or 2 packs a day for 15 years). Yearly screening should continue until the smoker has stopped smoking for at least 15 years. Yearly screening should also be stopped for people who develop a health problem that would prevent them from having lung cancer treatment.  If you are pregnant, do not drink alcohol. If you are breastfeeding, be very cautious about drinking alcohol. If you are not pregnant and choose to drink alcohol, do not exceed 1 drink per day. One drink is considered to be 12 ounces (355 mL) of beer, 5 ounces (148 mL) of wine, or 1.5 ounces (44 mL) of liquor.  Avoid use of street drugs. Do not share needles with anyone. Ask for help if you need support or instructions about stopping  the use of drugs.  High blood pressure causes heart disease and increases the risk of stroke. Blood pressure should be checked at least every 1 to 2 years. Ongoing high blood pressure should be treated with medicines, if weight loss and exercise are not effective.  If you are 59 to 59 years old, ask your caregiver if you should take aspirin to prevent strokes.  Diabetes screening involves taking a blood sample to check your fasting blood sugar level. This should be done once every 3  years, after age 91, if you are within normal weight and without risk factors for diabetes. Testing should be considered at a younger age or be carried out more frequently if you are overweight and have at least 1 risk factor for diabetes.  Breast cancer screening is essential preventative care for women. You should practice "breast self-awareness." This means understanding the normal appearance and feel of your breasts and may include breast self-examination. Any changes detected, no matter how small, should be reported to a caregiver. Women in their 66s and 30s should have a clinical breast exam (CBE) by a caregiver as part of a regular health exam every 1 to 3 years. After age 101, women should have a CBE every year. Starting at age 100, women should consider having a mammogram (breast X-ray) every year. Women who have a family history of breast cancer should talk to their caregiver about genetic screening. Women at a high risk of breast cancer should talk to their caregiver about having an MRI and a mammogram every year.  Breast cancer gene (BRCA)-related cancer risk assessment is recommended for women who have family members with BRCA-related cancers. BRCA-related cancers include breast, ovarian, tubal, and peritoneal cancers. Having family members with these cancers may be associated with an increased risk for harmful changes (mutations) in the breast cancer genes BRCA1 and BRCA2. Results of the assessment will determine the need for genetic counseling and BRCA1 and BRCA2 testing.  The Pap test is a screening test for cervical cancer. Women should have a Pap test starting at age 57. Between ages 25 and 35, Pap tests should be repeated every 2 years. Beginning at age 37, you should have a Pap test every 3 years as long as the past 3 Pap tests have been normal. If you had a hysterectomy for a problem that was not cancer or a condition that could lead to cancer, then you no longer need Pap tests. If you are  between ages 50 and 76, and you have had normal Pap tests going back 10 years, you no longer need Pap tests. If you have had past treatment for cervical cancer or a condition that could lead to cancer, you need Pap tests and screening for cancer for at least 20 years after your treatment. If Pap tests have been discontinued, risk factors (such as a new sexual partner) need to be reassessed to determine if screening should be resumed. Some women have medical problems that increase the chance of getting cervical cancer. In these cases, your caregiver may recommend more frequent screening and Pap tests.  The human papillomavirus (HPV) test is an additional test that may be used for cervical cancer screening. The HPV test looks for the virus that can cause the cell changes on the cervix. The cells collected during the Pap test can be tested for HPV. The HPV test could be used to screen women aged 44 years and older, and should be used in women of any age  who have unclear Pap test results. After the age of 55, women should have HPV testing at the same frequency as a Pap test.  Colorectal cancer can be detected and often prevented. Most routine colorectal cancer screening begins at the age of 44 and continues through age 20. However, your caregiver may recommend screening at an earlier age if you have risk factors for colon cancer. On a yearly basis, your caregiver may provide home test kits to check for hidden blood in the stool. Use of a small camera at the end of a tube, to directly examine the colon (sigmoidoscopy or colonoscopy), can detect the earliest forms of colorectal cancer. Talk to your caregiver about this at age 86, when routine screening begins. Direct examination of the colon should be repeated every 5 to 10 years through age 13, unless early forms of pre-cancerous polyps or small growths are found.  Hepatitis C blood testing is recommended for all people born from [birth year] through 1965 and any  individual with known risks for hepatitis C.  Practice safe sex. Use condoms and avoid high-risk sexual practices to reduce the spread of sexually transmitted infections (STIs). Sexually active women aged 36 and younger should be checked for Chlamydia, which is a common sexually transmitted infection. Older women with new or multiple partners should also be tested for Chlamydia. Testing for other STIs is recommended if you are sexually active and at increased risk.  Osteoporosis is a disease in which the bones lose minerals and strength with aging. This can result in serious bone fractures. The risk of osteoporosis can be identified using a bone density scan. Women ages 20 and over and women at risk for fractures or osteoporosis should discuss screening with their caregivers. Ask your caregiver whether you should be taking a calcium supplement or vitamin D to reduce the rate of osteoporosis.  Menopause can be associated with physical symptoms and risks. Hormone replacement therapy is available to decrease symptoms and risks. You should talk to your caregiver about whether hormone replacement therapy is right for you.  Use sunscreen. Apply sunscreen liberally and repeatedly throughout the day. You should seek shade when your shadow is shorter than you. Protect yourself by wearing long sleeves, pants, a wide-brimmed hat, and sunglasses year round, whenever you are outdoors.  Notify your caregiver of new moles or changes in moles, especially if there is a change in shape or color. Also notify your caregiver if a mole is larger than the size of a pencil eraser.  Stay current with your immunizations. Document Released: 12/23/2010 Document Revised: 10/04/2012 Document Reviewed: 12/23/2010 [facility] Patient Information 2014 [employer].

## 2014-10-28 LAB — URINALYSIS W MICROSCOPIC + REFLEX CULTURE
BILIRUBIN URINE: NEGATIVE
Bacteria, UA: NONE SEEN
CRYSTALS: NONE SEEN
Casts: NONE SEEN
GLUCOSE, UA: NEGATIVE mg/dL
Hgb urine dipstick: NEGATIVE
Ketones, ur: NEGATIVE mg/dL
Leukocytes, UA: NEGATIVE
Nitrite: NEGATIVE
PH: 6 (ref 5.0–8.0)
Protein, ur: NEGATIVE mg/dL
SPECIFIC GRAVITY, URINE: 1.024 (ref 1.005–1.030)
SQUAMOUS EPITHELIAL / LPF: NONE SEEN
Urobilinogen, UA: 1 mg/dL (ref 0.0–1.0)

## 2014-10-30 ENCOUNTER — Telehealth: Payer: Self-pay | Admitting: *Deleted

## 2014-10-30 LAB — CYTOLOGY - PAP

## 2014-10-30 NOTE — Telephone Encounter (Signed)
-----   Message from Anastasio Auerbach, MD sent at 10/27/2014  4:04 PM EDT ----- Call patient and tell her that. She will need to call [facility] and make an appointment for a follow up mammogram of the other breast as she has not had that done for over a year. ----- Message -----    From: Thamas Jaegers    Sent: 10/27/2014   2:57 PM      To: Anastasio Auerbach, MD  Dr.Fonatine I called [facility] and was told the below is correct she only had right breast diag. Mammogram, not a bilateral mammogram ----- Message -----    From: Anastasio Auerbach, MD    Sent: 10/27/2014  10:30 AM      To: [place] with [facility]. Her mammogram report from March gives a right breast mammogram report but patient states she had a bilateral mammogram. If they could supply a complete report reflecting this I would appreciate it.

## 2014-10-30 NOTE — Telephone Encounter (Signed)
Pt informed with the below. 

## 2015-11-01 ENCOUNTER — Ambulatory Visit (INDEPENDENT_AMBULATORY_CARE_PROVIDER_SITE_OTHER): Payer: BLUE CROSS/BLUE SHIELD | Admitting: Gynecology

## 2015-11-01 ENCOUNTER — Encounter: Payer: Self-pay | Admitting: Gynecology

## 2015-11-01 VITALS — BP 120/76 | Ht 65.0 in | Wt 192.0 lb

## 2015-11-01 DIAGNOSIS — Z01419 Encounter for gynecological examination (general) (routine) without abnormal findings: Secondary | ICD-10-CM

## 2015-11-01 DIAGNOSIS — Z1321 Encounter for screening for nutritional disorder: Secondary | ICD-10-CM

## 2015-11-01 DIAGNOSIS — Z13220 Encounter for screening for lipoid disorders: Secondary | ICD-10-CM

## 2015-11-01 DIAGNOSIS — Z1329 Encounter for screening for other suspected endocrine disorder: Secondary | ICD-10-CM | POA: Diagnosis not present

## 2015-11-01 DIAGNOSIS — N814 Uterovaginal prolapse, unspecified: Secondary | ICD-10-CM

## 2015-11-01 LAB — COMPREHENSIVE METABOLIC PANEL
ALT: 18 U/L (ref 6–29)
AST: 17 U/L (ref 10–35)
Albumin: 4.3 g/dL (ref 3.6–5.1)
Alkaline Phosphatase: 55 U/L (ref 33–130)
BUN: 18 mg/dL (ref 7–25)
CHLORIDE: 103 mmol/L (ref 98–110)
CO2: 25 mmol/L (ref 20–31)
Calcium: 9.2 mg/dL (ref 8.6–10.4)
Creat: 0.97 mg/dL (ref 0.50–1.05)
Glucose, Bld: 91 mg/dL (ref 65–99)
Potassium: 4 mmol/L (ref 3.5–5.3)
Sodium: 140 mmol/L (ref 135–146)
Total Bilirubin: 0.6 mg/dL (ref 0.2–1.2)
Total Protein: 6.6 g/dL (ref 6.1–8.1)

## 2015-11-01 LAB — CBC WITH DIFFERENTIAL/PLATELET
Basophils Absolute: 53 cells/uL (ref 0–200)
Basophils Relative: 1 %
EOS ABS: 106 {cells}/uL (ref 15–500)
Eosinophils Relative: 2 %
HEMATOCRIT: 40.5 % (ref 35.0–45.0)
Hemoglobin: 13.7 g/dL (ref 11.7–15.5)
LYMPHS PCT: 35 %
Lymphs Abs: 1855 cells/uL (ref 850–3900)
MCH: 31.6 pg (ref 27.0–33.0)
MCHC: 33.8 g/dL (ref 32.0–36.0)
MCV: 93.5 fL (ref 80.0–100.0)
MONO ABS: 583 {cells}/uL (ref 200–950)
MPV: 9.1 fL (ref 7.5–12.5)
Monocytes Relative: 11 %
NEUTROS PCT: 51 %
Neutro Abs: 2703 cells/uL (ref 1500–7800)
Platelets: 351 10*3/uL (ref 140–400)
RBC: 4.33 MIL/uL (ref 3.80–5.10)
RDW: 13.8 % (ref 11.0–15.0)
WBC: 5.3 10*3/uL (ref 3.8–10.8)

## 2015-11-01 LAB — LIPID PANEL
CHOL/HDL RATIO: 4.7 ratio (ref <=5.0)
Cholesterol: 210 mg/dL — ABNORMAL HIGH (ref 125–200)
HDL: 45 mg/dL — AB (ref >=46)
LDL CALC: 135 mg/dL — AB (ref <130)
Triglycerides: 150 mg/dL — ABNORMAL HIGH (ref <150)
VLDL: 30 mg/dL (ref <30)

## 2015-11-01 LAB — TSH: TSH: 1.56 m[IU]/L

## 2015-11-01 NOTE — Progress Notes (Signed)
    Deborah Sandoval [birth year] [id number]        60 y.o.  [id number]  for annual exam.  Doing well.  Past medical history,surgical history, problem list, medications, allergies, family history and social history were all reviewed and documented as reviewed in the EPIC chart.  ROS:  Performed with pertinent positives and negatives included in the history, assessment and plan.   Additional significant findings :  none   Exam: [name] assistant Filed Vitals:   11/01/15 0845  BP: 120/76  Height: 5\' 5"  (1.651 m)  Weight: 192 lb (87.091 kg)   General appearance:  Normal affect, orientation and appearance. Skin: Grossly normal HEENT: Without gross lesions.  No cervical or supraclavicular adenopathy. Thyroid normal.  Lungs:  Clear without wheezing, rales or rhonchi Cardiac: RR, without RMG Abdominal:  Soft, nontender, without masses, guarding, rebound, organomegaly or hernia Breasts:  Examined lying and sitting without masses, retractions, discharge or axillary adenopathy.  Well-healed bilateral reduction scars Pelvic:  Ext/BUS/vagina normal with mild atrophic changes. First-degree uterine prolapse noted.  Cervix normal.  Uterus anteverted, normal size, shape and contour, midline and mobile nontender   Adnexa without masses or tenderness    Anus and perineum normal   Rectovaginal normal sphincter tone without palpated masses or tenderness.    Assessment/Plan:  60 y.o. [id number] female for annual exam.   1. Postmenopausal/atrophic genital changes. Without significant hot flushes, night sweats, vaginal dryness or any vaginal bleeding. Continue to monitor report any issues or bleeding. 2. First-degree uterine prolapse. Asymptomatic to the patient. Again discussed symptoms with her and she will call if she develops any issues. 3. Mammography 09/2015. Continue with annual mammography when due. SBE monthly reviewed. 4. History of osteopenia with DEXA T score -1.1. Most recent DEXA 2012  normal. Plan DEXA next year at age 21. Check vitamin D level today. 5. Pap smear 2016. No Pap smear done today. No history of abnormal Pap smears previously. 6. Colonoscopy 2013. Repeat at their recommended interval. 7. Health maintenance. Patient requests baseline labs. CBC, CMP, lipid profile, TSH, vitamin D, urinalysis ordered. Follow up 1 year, sooner as needed.   [name], 9:06 AM 11/01/2015

## 2015-11-01 NOTE — Patient Instructions (Signed)

## 2015-11-02 LAB — URINALYSIS W MICROSCOPIC + REFLEX CULTURE
Bacteria, UA: NONE SEEN [HPF]
Bilirubin Urine: NEGATIVE
CRYSTALS: NONE SEEN [HPF]
Casts: NONE SEEN [LPF]
Glucose, UA: NEGATIVE
Hgb urine dipstick: NEGATIVE
Ketones, ur: NEGATIVE
Leukocytes, UA: NEGATIVE
Nitrite: NEGATIVE
PH: 5.5 (ref 5.0–8.0)
Protein, ur: NEGATIVE
RBC / HPF: NONE SEEN RBC/HPF (ref <=2)
SPECIFIC GRAVITY, URINE: 1.021 (ref 1.001–1.035)
Squamous Epithelial / LPF: NONE SEEN [HPF] (ref <=5)
WBC UA: NONE SEEN WBC/HPF (ref <=5)
Yeast: NONE SEEN [HPF]

## 2015-11-02 LAB — VITAMIN D 25 HYDROXY (VIT D DEFICIENCY, FRACTURES): Vit D, 25-Hydroxy: 23 ng/mL — ABNORMAL LOW (ref 30–100)

## 2015-11-06 ENCOUNTER — Other Ambulatory Visit: Payer: Self-pay | Admitting: Gynecology

## 2015-11-06 DIAGNOSIS — E7800 Pure hypercholesterolemia, unspecified: Secondary | ICD-10-CM

## 2016-02-06 ENCOUNTER — Other Ambulatory Visit: Payer: Self-pay

## 2016-02-14 ENCOUNTER — Other Ambulatory Visit: Payer: BLUE CROSS/BLUE SHIELD

## 2016-02-14 ENCOUNTER — Other Ambulatory Visit: Payer: Self-pay | Admitting: *Deleted

## 2016-02-14 DIAGNOSIS — E559 Vitamin D deficiency, unspecified: Secondary | ICD-10-CM

## 2016-02-14 DIAGNOSIS — E7800 Pure hypercholesterolemia, unspecified: Secondary | ICD-10-CM

## 2016-02-14 LAB — LIPID PANEL
CHOLESTEROL: 256 mg/dL — AB (ref 125–200)
HDL: 54 mg/dL (ref >=46)
LDL Cholesterol: 178 mg/dL — ABNORMAL HIGH (ref <130)
TRIGLYCERIDES: 119 mg/dL (ref <150)
Total CHOL/HDL Ratio: 4.7 Ratio (ref <=5.0)
VLDL: 24 mg/dL (ref <30)

## 2016-02-15 LAB — VITAMIN D 25 HYDROXY (VIT D DEFICIENCY, FRACTURES): Vit D, 25-Hydroxy: 35 ng/mL (ref 30–100)

## 2016-11-03 ENCOUNTER — Encounter: Payer: BLUE CROSS/BLUE SHIELD | Admitting: Gynecology

## 2016-11-28 ENCOUNTER — Encounter: Payer: Self-pay | Admitting: Gynecology

## 2016-12-01 ENCOUNTER — Ambulatory Visit (INDEPENDENT_AMBULATORY_CARE_PROVIDER_SITE_OTHER): Payer: BLUE CROSS/BLUE SHIELD | Admitting: Gynecology

## 2016-12-01 ENCOUNTER — Encounter: Payer: Self-pay | Admitting: Gynecology

## 2016-12-01 VITALS — BP 122/76 | Ht 65.0 in | Wt 195.0 lb

## 2016-12-01 DIAGNOSIS — N952 Postmenopausal atrophic vaginitis: Secondary | ICD-10-CM | POA: Diagnosis not present

## 2016-12-01 DIAGNOSIS — E559 Vitamin D deficiency, unspecified: Secondary | ICD-10-CM

## 2016-12-01 DIAGNOSIS — Z01411 Encounter for gynecological examination (general) (routine) with abnormal findings: Secondary | ICD-10-CM | POA: Diagnosis not present

## 2016-12-01 DIAGNOSIS — E7800 Pure hypercholesterolemia, unspecified: Secondary | ICD-10-CM

## 2016-12-01 DIAGNOSIS — M8580 Other specified disorders of bone density and structure, unspecified site: Secondary | ICD-10-CM

## 2016-12-01 DIAGNOSIS — N814 Uterovaginal prolapse, unspecified: Secondary | ICD-10-CM

## 2016-12-01 LAB — CBC WITH DIFFERENTIAL/PLATELET
BASOS PCT: 1 %
Basophils Absolute: 58 cells/uL (ref 0–200)
Eosinophils Absolute: 116 cells/uL (ref 15–500)
Eosinophils Relative: 2 %
HEMATOCRIT: 44.1 % (ref 35.0–45.0)
Hemoglobin: 14.8 g/dL (ref 11.7–15.5)
LYMPHS PCT: 35 %
Lymphs Abs: 2030 cells/uL (ref 850–3900)
MCH: 31 pg (ref 27.0–33.0)
MCHC: 33.6 g/dL (ref 32.0–36.0)
MCV: 92.5 fL (ref 80.0–100.0)
MONO ABS: 638 {cells}/uL (ref 200–950)
MPV: 9.3 fL (ref 7.5–12.5)
Monocytes Relative: 11 %
Neutro Abs: 2958 cells/uL (ref 1500–7800)
Neutrophils Relative %: 51 %
PLATELETS: 388 10*3/uL (ref 140–400)
RBC: 4.77 MIL/uL (ref 3.80–5.10)
RDW: 13.3 % (ref 11.0–15.0)
WBC: 5.8 10*3/uL (ref 3.8–10.8)

## 2016-12-01 LAB — COMPREHENSIVE METABOLIC PANEL
ALT: 19 U/L (ref 6–29)
AST: 18 U/L (ref 10–35)
Albumin: 4.2 g/dL (ref 3.6–5.1)
Alkaline Phosphatase: 53 U/L (ref 33–130)
BILIRUBIN TOTAL: 0.7 mg/dL (ref 0.2–1.2)
BUN: 17 mg/dL (ref 7–25)
CO2: 21 mmol/L (ref 20–31)
Calcium: 10 mg/dL (ref 8.6–10.4)
Chloride: 105 mmol/L (ref 98–110)
Creat: 0.95 mg/dL (ref 0.50–0.99)
GLUCOSE: 101 mg/dL — AB (ref 65–99)
Potassium: 4.3 mmol/L (ref 3.5–5.3)
Sodium: 141 mmol/L (ref 135–146)
Total Protein: 6.9 g/dL (ref 6.1–8.1)

## 2016-12-01 LAB — LIPID PANEL
Cholesterol: 220 mg/dL — ABNORMAL HIGH (ref <200)
HDL: 43 mg/dL — ABNORMAL LOW (ref >50)
LDL Cholesterol: 145 mg/dL — ABNORMAL HIGH (ref <100)
Total CHOL/HDL Ratio: 5.1 Ratio — ABNORMAL HIGH (ref <5.0)
Triglycerides: 159 mg/dL — ABNORMAL HIGH (ref <150)
VLDL: 32 mg/dL — ABNORMAL HIGH (ref <30)

## 2016-12-01 LAB — VITAMIN D 25 HYDROXY (VIT D DEFICIENCY, FRACTURES): Vit D, 25-Hydroxy: 26 ng/mL — ABNORMAL LOW (ref 30–100)

## 2016-12-01 LAB — TSH: TSH: 1.2 m[IU]/L

## 2016-12-01 NOTE — Patient Instructions (Signed)
Follow up fo bone density as scheduled

## 2016-12-01 NOTE — Progress Notes (Signed)
    KEYSHA Sandoval [birth year] Sandoval        60 y.o.  [id number] for annual exam.   Past medical history,surgical history, problem list, medications, allergies, family history and social history were all reviewed and documented as reviewed in the EPIC chart.  ROS:  Performed with pertinent positives and negatives included in the history, assessment and plan.   Additional significant findings :  None   Exam: [name] assistant Vitals:   12/01/16 0852  BP: 122/76  Weight: 195 lb (88.5 kg)  Height: 5\' 5"  (1.651 m)   Body mass index is 32.45 kg/m.  General appearance:  Normal affect, orientation and appearance. Skin: Grossly normal HEENT: Without gross lesions.  No cervical or supraclavicular adenopathy. Thyroid normal.  Lungs:  Clear without wheezing, rales or rhonchi Cardiac: RR, without RMG Abdominal:  Soft, nontender, without masses, guarding, rebound, organomegaly or hernia Breasts:  Examined lying and sitting without masses, retractions, discharge or axillary adenopathy. Pelvic:  Ext, BUS, Vagina: With atrophic changes. First-degree uterine prolapse noted.  Cervix: With atrophic changes  Uterus: Anteverted, normal size, shape and contour, midline and mobile nontender   Adnexa: Without masses or tenderness    Anus and perineum: Normal   Rectovaginal: Normal sphincter tone without palpated masses or tenderness.    Assessment/Plan:  61 y.o. [id number] female for annual exam.   1. Postmenopausal/atrophic genital changes. No significant hot flushes, night sweats, vaginal dryness or any vaginal bleeding. Continue to monitor report any issues or bleeding. 2. First-degree uterine prolapse asymptomatic to the patient. Continue to monitor with annual exams. Report any symptoms that may develop. 3. Mammography 11/2016. Continue with annual mammography when due. SBE monthly reviewed. Breast exam normal today. 4. DEXA 2012 normal. Prior DEXA showed osteopenia with T score -1.1.  Schedule DEXA now and patient will follow up for this. History of vitamin D deficiency in the past. Check vitamin D level today. 5. Pap smear 2016. No Pap smear done today. No history of abnormal Pap smears. Plan repeat Pap smear next year at 3 year interval per current screening guidelines. 6. Colonoscopy 2013. Repeat at their recommended interval. 7. Health maintenance. Baseline CBC, CMP, lipid profile, TSH, vitamin D ordered. History of elevated cholesterol in the past. Recommended the patient follow up with her primary physician which she never did. Will follow up on her lipid profile now and I stressed the need to follow up with her primary if continues elevated. Follow up for gynecologic exam in one year.   [name], 9:28 AM 12/01/2016

## 2016-12-03 ENCOUNTER — Other Ambulatory Visit: Payer: Self-pay | Admitting: Gynecology

## 2016-12-03 DIAGNOSIS — E559 Vitamin D deficiency, unspecified: Secondary | ICD-10-CM

## 2017-01-21 DIAGNOSIS — M8580 Other specified disorders of bone density and structure, unspecified site: Secondary | ICD-10-CM

## 2017-01-21 HISTORY — DX: Other specified disorders of bone density and structure, unspecified site: M85.80

## 2017-02-05 ENCOUNTER — Ambulatory Visit (INDEPENDENT_AMBULATORY_CARE_PROVIDER_SITE_OTHER): Payer: BLUE CROSS/BLUE SHIELD

## 2017-02-05 ENCOUNTER — Other Ambulatory Visit: Payer: Self-pay | Admitting: Gynecology

## 2017-02-05 ENCOUNTER — Encounter: Payer: Self-pay | Admitting: Gynecology

## 2017-02-05 DIAGNOSIS — M8580 Other specified disorders of bone density and structure, unspecified site: Secondary | ICD-10-CM

## 2017-02-05 DIAGNOSIS — Z13820 Encounter for screening for osteoporosis: Secondary | ICD-10-CM

## 2017-02-05 DIAGNOSIS — M8588 Other specified disorders of bone density and structure, other site: Secondary | ICD-10-CM

## 2017-05-18 ENCOUNTER — Other Ambulatory Visit: Payer: Self-pay | Admitting: Gastroenterology

## 2017-05-18 DIAGNOSIS — Z1211 Encounter for screening for malignant neoplasm of colon: Secondary | ICD-10-CM

## 2017-05-29 ENCOUNTER — Ambulatory Visit
Admission: RE | Admit: 2017-05-29 | Discharge: 2017-05-29 | Disposition: A | Discharge status: Home / Self Care | Payer: BLUE CROSS/BLUE SHIELD | Source: Ambulatory Visit | Attending: Gastroenterology | Admitting: Gastroenterology

## 2017-05-29 DIAGNOSIS — Z1211 Encounter for screening for malignant neoplasm of colon: Secondary | ICD-10-CM

## 2017-11-09 ENCOUNTER — Encounter: Payer: Self-pay | Admitting: Family Medicine

## 2017-11-09 ENCOUNTER — Ambulatory Visit: Payer: BLUE CROSS/BLUE SHIELD | Admitting: Family Medicine

## 2017-11-09 VITALS — BP 126/62 | HR 62 | Temp 98.0°F | Ht 66.0 in | Wt 196.0 lb

## 2017-11-09 DIAGNOSIS — E782 Mixed hyperlipidemia: Secondary | ICD-10-CM | POA: Diagnosis not present

## 2017-11-09 DIAGNOSIS — Z7689 Persons encountering health services in other specified circumstances: Secondary | ICD-10-CM

## 2017-11-09 DIAGNOSIS — L989 Disorder of the skin and subcutaneous tissue, unspecified: Secondary | ICD-10-CM | POA: Diagnosis not present

## 2017-11-09 LAB — LIPID PANEL
Cholesterol: 197 mg/dL (ref 0–200)
HDL: 45.8 mg/dL (ref >39.00)
LDL CALC: 129 mg/dL — AB (ref 0–99)
NONHDL: 150.72
Total CHOL/HDL Ratio: 4
Triglycerides: 111 mg/dL (ref 0.0–149.0)
VLDL: 22.2 mg/dL (ref 0.0–40.0)

## 2017-11-09 NOTE — Patient Instructions (Addendum)
Cholesterol Cholesterol is a white, waxy, fat-like substance that is needed by the human body in small amounts. The liver makes all the cholesterol we need. Cholesterol is carried from the liver by the blood through the blood vessels. Deposits of cholesterol (plaques) may build up on blood vessel (artery) walls. Plaques make the arteries narrower and stiffer. Cholesterol plaques increase the risk for heart attack and stroke. You cannot feel your cholesterol level even if it is very high. The only way to know that it is high is to have a blood test. Once you know your cholesterol levels, you should keep a record of the test results. Work with your health care provider to keep your levels in the desired range. What do the results mean?  Total cholesterol is a rough measure of all the cholesterol in your blood.  LDL (low-density lipoprotein) is the "bad" cholesterol. This is the type that causes plaque to build up on the artery walls. You want this level to be low.  HDL (high-density lipoprotein) is the "good" cholesterol because it cleans the arteries and carries the LDL away. You want this level to be high.  Triglycerides are fat that the body can either burn for energy or store. High levels are closely linked to heart disease. What are the desired levels of cholesterol?  Total cholesterol below 200.  LDL below 100 for people who are at risk, below 70 for people at very high risk.  HDL above 40 is good. A level of 60 or higher is considered to be protective against heart disease.  Triglycerides below 150. How can I lower my cholesterol? Diet Follow your diet program as told by your health care provider.  Choose fish or white meat chicken and [place], roasted or baked. Limit fatty cuts of red meat, fried foods, and processed meats, such as sausage and lunch meats.  Eat lots of fresh fruits and vegetables.  Choose whole grains, beans, pasta, potatoes, and cereals.  Choose olive oil, corn  oil, or canola oil, and use only small amounts.  Avoid butter, mayonnaise, shortening, or palm kernel oils.  Avoid foods with trans fats.  Drink skim or nonfat milk and eat low-fat or nonfat yogurt and cheeses. Avoid whole milk, cream, ice cream, egg yolks, and full-fat cheeses.  Healthier desserts include angel food cake, ginger snaps, animal crackers, hard candy, popsicles, and low-fat or nonfat frozen yogurt. Avoid pastries, cakes, pies, and cookies.  Exercise  Follow your exercise program as told by your health care provider. A regular program: ? Helps to decrease LDL and raise HDL. ? Helps with weight control.  Do things that increase your activity level, such as gardening, walking, and taking the stairs.  Ask your health care provider about ways that you can be more active in your daily life.  Medicine  Take over-the-counter and prescription medicines only as told by your health care provider. ? Medicine may be prescribed by your health care provider to help lower cholesterol and decrease the risk for heart disease. This is usually done if diet and exercise have failed to bring down cholesterol levels. ? If you have several risk factors, you may need medicine even if your levels are normal.  This information is not intended to replace advice given to you by your health care provider. Make sure you discuss any questions you have with your health care provider. Document Released: 03/04/2001 Document Revised: 01/05/2016 Document Reviewed: 12/08/2015 Elsevier Interactive Patient Education  2018 [place] and  Cholesterol Restricted Diet High levels of fat and cholesterol in your blood may lead to various health problems, such as diseases of the heart, blood vessels, gallbladder, liver, and pancreas. Fats are concentrated sources of energy that come in various forms. Certain types of fat, including saturated fat, may be harmful in excess. Cholesterol is a substance needed by  your body in small amounts. Your body makes all the cholesterol it needs. Excess cholesterol comes from the food you eat. When you have high levels of cholesterol and saturated fat in your blood, health problems can develop because the excess fat and cholesterol will gather along the walls of your blood vessels, causing them to narrow. Choosing the right foods will help you control your intake of fat and cholesterol. This will help keep the levels of these substances in your blood within normal limits and reduce your risk of disease. What is my plan? Your health care provider recommends that you:  Limit your fat intake to ______% or less of your total calories per day.  Limit the amount of cholesterol in your diet to less than _________mg per day.  Eat 20-30 grams of fiber each day.  What types of fat should I choose?  Choose healthy fats more often. Choose monounsaturated and polyunsaturated fats, such as olive and canola oil, flaxseeds, walnuts, almonds, and seeds.  Eat more omega-3 fats. Good choices include salmon, mackerel, sardines, tuna, flaxseed oil, and ground flaxseeds. Aim to eat fish at least two times a week.  Limit saturated fats. Saturated fats are primarily found in animal products, such as meats, butter, and cream. Plant sources of saturated fats include palm oil, palm kernel oil, and coconut oil.  Avoid foods with partially hydrogenated oils in them. These contain trans fats. Examples of foods that contain trans fats are stick margarine, some tub margarines, cookies, crackers, and other baked goods. What general guidelines do I need to follow? These guidelines for healthy eating will help you control your intake of fat and cholesterol:  Check food labels carefully to identify foods with trans fats or high amounts of saturated fat.  Fill one half of your plate with vegetables and green salads.  Fill one fourth of your plate with whole grains. Look for the word "whole" as  the first word in the ingredient list.  Fill one fourth of your plate with lean protein foods.  Limit fruit to two servings a day. Choose fruit instead of juice.  Eat more foods that contain fiber, such as apples, broccoli, carrots, beans, peas, and barley.  Eat more home-cooked food and less restaurant, buffet, and fast food.  Limit or avoid alcohol.  Limit foods high in starch and sugar.  Limit fried foods.  Cook foods using methods other than frying. Baking, boiling, grilling, and broiling are all great options.  Lose weight if you are overweight. Losing just 5-10% of your initial body weight can help your overall health and prevent diseases such as diabetes and heart disease.  What foods can I eat? Grains  Whole grains, such as whole wheat or whole grain breads, crackers, cereals, and pasta. Unsweetened oatmeal, bulgur, barley, quinoa, or brown rice. Corn or whole wheat flour tortillas. Vegetables  Fresh or frozen vegetables (raw, steamed, roasted, or grilled). Green salads. Fruits  All fresh, canned (in natural juice), or frozen fruits. Meats and other protein foods  Ground beef (85% or leaner), grass-fed beef, or beef trimmed of fat. Skinless chicken or [place]. Ground chicken or [place]. Pork  trimmed of fat. All fish and seafood. Eggs. Dried beans, peas, or lentils. Unsalted nuts or seeds. Unsalted canned or dry beans. Dairy  Low-fat dairy products, such as skim or 1% milk, 2% or reduced-fat cheeses, low-fat ricotta or cottage cheese, or plain low-fat yo Fats and oils  Tub margarines without trans fats. Light or reduced-fat mayonnaise and salad dressings. Avocado. Olive, canola, sesame, or safflower oils. Natural peanut or almond butter (choose ones without added sugar and oil). The items listed above may not be a complete list of recommended foods or beverages. Contact your dietitian for more options. Foods to avoid Grains  White bread. White pasta. White rice.  Cornbread. Bagels, pastries, and croissants. Crackers that contain trans fat. Vegetables  White potatoes. Corn. Creamed or fried vegetables. Vegetables in a cheese sauce. Fruits  Dried fruits. Canned fruit in light or heavy syrup. Fruit juice. Meats and other protein foods  Fatty cuts of meat. Ribs, chicken wings, bacon, sausage, bologna, salami, chitterlings, fatback, hot dogs, bratwurst, and packaged luncheon meats. Liver and organ meats. Dairy  Whole or 2% milk, cream, half-and-half, and cream cheese. Whole milk cheeses. Whole-fat or sweetened yogurt. Full-fat cheeses. Nondairy creamers and whipped toppings. Processed cheese, cheese spreads, or cheese curds. Beverages  Alcohol. Sweetened drinks (such as sodas, lemonade, and fruit drinks or punches). Fats and oils  Butter, stick margarine, lard, shortening, ghee, or bacon fat. Coconut, palm kernel, or palm oils. Sweets and desserts  Corn syrup, sugars, honey, and molasses. Candy. Jam and jelly. Syrup. Sweetened cereals. Cookies, pies, cakes, donuts, muffins, and ice cream. The items listed above may not be a complete list of foods and beverages to avoid. Contact your dietitian for more information. This information is not intended to replace advice given to you by your health care provider. Make sure you discuss any questions you have with your health care provider. Document Released: 06/09/2005 Document Revised: 06/30/2014 Document Reviewed: 09/07/2013 Elsevier Interactive Patient Education  [employer].

## 2017-11-09 NOTE — Progress Notes (Addendum)
Patient presents to clinic today to establish care.  SUBJECTIVE: PMH: Pt os a 62 yo female with pmh sig HLD, h/o thyroid dz.  Patient was previously seen in [facility] by Dr. Minna Antis.  Pt is also followed by OB/GYN, Dr. Phineas Real.  HLD: -Pt endorses history of high cholesterol. -Her cholesterol/checked in June 2018 -She is not currently on any medication -Pt endorses  Skin lesion: -Patient endorses skin lesion on right upper chest. -Duration times a month or 2. -Patient has made an appointment with dermatology however cannot be seen until August. -The area at times itches/becomes irritated.  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  In the past was followed by OB/Gyn.  Allergies: NKDA  Past surgical history: cholecystectomy 2012 C-section 1991 Removal of benign thyroid adenoma, half of thyroid remaining.  Not currently on medication  Social history: Patient is married.  Pt has 3 boys ages 62, 79, and 38.  She is employed as a residential Engineer, agricultural.  Patient denies alcohol, tobacco, drug use.  Family medical history: Mom-deceased, sarcoma Dad-deceased, "spots on lungs Sister-Peggy, alive, colon cancer Sister-Teresa, alive, breast cancer Brother-pack, alive, Parkinson's Patient has 9 siblings in total the others are healthy.  Health Maintenance: Dental -- Dr. Lelon Frohlich [facility] -- Dr. Jorja Loa [facility] Hoff Immunizations -- had shingles in 2012, influenza vaccine 2018 Colonoscopy -- 2018 Mammogram --2018 PAP --June 2018  Past Medical History:  Diagnosis Date  . Adenoma    ON THYROID--REMOVED  . Herniated disc   . History of shingles   . Osteopenia 01/2017   T score -1.4 FRAX 6.5%/0.5%  . Thyroid disease    Nodule    Past Surgical History:  Procedure  Laterality Date  . BREAST SURGERY     REDUCTION MAMMAPLASTY  . CESAREAN SECTION  1991  . LAPAROSCOPIC CHOLECYSTECTOMY    . THYROIDECTOMY, PARTIAL      Current Outpatient Medications on File Prior to Visit  Medication Sig Dispense Refill  . Calcium Carbonate-Vitamin D (CALCIUM + D PO) Take by mouth.      . Magnesium 100 MG CAPS Take by mouth.     No current facility-administered medications on file prior to visit.     No Known Allergies  Family History  Problem Relation Age of Onset  . Cancer Father        LUNG  . Cancer Mother        Sarcoma  . Breast cancer Sister        Age 38  . Diabetes Maternal Uncle   . Colon cancer Sister     Social History   Socioeconomic History  . Marital status: Married    Spouse name: Not on file  . Number of children: Not on file  . Years of education: Not on file  . Highest education level: Not on file  Occupational History  . Not on file  Social Needs  . Financial resource strain: Not on file  . Food insecurity:    Worry: Not on file    Inability: Not on file  . Transportation needs:    Medical: Not on file    Non-medical: Not on file  Tobacco Use  . Smoking status: Never Smoker  . Smokeless tobacco: Never Used  Substance and Sexual Activity  . Alcohol use: Yes  Alcohol/week: 0.0 oz    Comment: Occas wine  . Drug use: No  . Sexual activity: Yes    Birth control/protection: Post-menopausal    Comment: 1st intercourse 62 yo-Fewer than 5 partners  Lifestyle  . Physical activity:    Days per week: Not on file    Minutes per session: Not on file  . Stress: Not on file  Relationships  . Social connections:    Talks on phone: Not on file    Gets together: Not on file    Attends religious service: Not on file    Active member of club or organization: Not on file    Attends meetings of clubs or organizations: Not on file    Relationship status: Not on file  . Intimate partner violence:    Fear of current or ex partner:  Not on file    Emotionally abused: Not on file    Physically abused: Not on file    Forced sexual activity: Not on file  Other Topics Concern  . Not on file  Social History Narrative  . Not on file    ROS General: Denies fever, chills, night sweats, changes in weight, changes in appetite HEENT: Denies headaches, ear pain, changes in vision, rhinorrhea, sore throat CV: Denies CP, palpitations, SOB, orthopnea Pulm: Denies SOB, cough, wheezing GI: Denies abdominal pain, nausea, vomiting, diarrhea, constipation GU: Denies dysuria, hematuria, frequency, vaginal discharge Msk: Denies muscle cramps, joint pains Neuro: Denies weakness, numbness, tingling Skin: Denies rashes, bruising   +spot on chest that wont heal. Psych: Denies depression, anxiety, hallucinations  BP 126/62 (BP Location: Left Arm, Patient Position: Sitting, Cuff Size: Normal)   Pulse 62   Temp 98 F (36.7 C) (Oral)   Ht 5\' 6"  (1.676 m)   Wt 196 lb (88.9 kg)   SpO2 96%   BMI 31.64 kg/m   Physical Exam Gen. Pleasant, well developed, well-nourished, in NAD HEENT - [place]/AT, PERRL, no scleral icterus, no nasal drainage, pharynx without erythema or exudate.  TMs normal bilaterally.  No cervical lymphadenopathy. Neck: No JVD, no thyromegaly, no carotid bruits Lungs: no use of accessory muscles, CTAB, no wheezes, rales or rhonchi Cardiovascular: RRR, No r/g/m, no peripheral edema Abdomen: BS present, soft, nontender, nondistended Musculoskeletal: No deformities, moves all four extremities, no cyanosis or clubbing, normal tone Neuro:  A&Ox3, CN II-XII intact, normal gait Skin:  Warm, dry, intact,R upper chest with 1 cm flesh toned lesion with a rolled edge and central area of skin irritation, no drainage or erythema noted. Numerous freckles on UEs b/l.  No results found for this or any previous visit (from the past 2160 hour(s)).  Assessment/Plan: Mixed hyperlipidemia  -Discussed increasing physical activity, limiting  saturated fats, and increasing whole grains to help decrease cholesterol - Plan: Lipid panel  Skin lesion -recommend removal of lesion as concerning. -pt advised to keep Dermatology appointment  Encounter to establish care -We reviewed the PMH, PSH, FH, SH, Meds and Allergies. -We provided refills for any medications we will prescribe as needed. -We addressed current concerns per orders and patient instructions. -We have asked for records for pertinent exams, studies, vaccines and notes from previous providers. -We have advised patient to follow up per instructions below.  F/u in the next few months for CPE  Grier Mitts, MD

## 2017-11-21 DIAGNOSIS — R87610 Atypical squamous cells of undetermined significance on cytologic smear of cervix (ASC-US): Secondary | ICD-10-CM

## 2017-11-21 HISTORY — DX: Atypical squamous cells of undetermined significance on cytologic smear of cervix (ASC-US): R87.610

## 2017-12-02 ENCOUNTER — Encounter: Payer: Self-pay | Admitting: Gynecology

## 2017-12-02 ENCOUNTER — Ambulatory Visit: Payer: BLUE CROSS/BLUE SHIELD | Admitting: Gynecology

## 2017-12-02 VITALS — BP 118/76 | Ht 65.0 in | Wt 193.0 lb

## 2017-12-02 DIAGNOSIS — N952 Postmenopausal atrophic vaginitis: Secondary | ICD-10-CM | POA: Diagnosis not present

## 2017-12-02 DIAGNOSIS — N814 Uterovaginal prolapse, unspecified: Secondary | ICD-10-CM | POA: Diagnosis not present

## 2017-12-02 DIAGNOSIS — M8580 Other specified disorders of bone density and structure, unspecified site: Secondary | ICD-10-CM | POA: Diagnosis not present

## 2017-12-02 DIAGNOSIS — Z01419 Encounter for gynecological examination (general) (routine) without abnormal findings: Secondary | ICD-10-CM | POA: Diagnosis not present

## 2017-12-02 NOTE — Patient Instructions (Signed)
Follow-up in 1 year for annual exam, sooner if any issues. 

## 2017-12-02 NOTE — Progress Notes (Signed)
    Deborah Sandoval [place] 1956-02-28 [id number]        62 y.o.  [id number] for annual gynecologic exam.  Doing well without gynecologic complaints  Past medical history,surgical history, problem list, medications, allergies, family history and social history were all reviewed and documented as reviewed in the EPIC chart.  ROS:  Performed with pertinent positives and negatives included in the history, assessment and plan.   Additional significant findings : None   Exam: [name] assistant Vitals:   12/02/17 0905  BP: 118/76  Weight: 193 lb (87.5 kg)  Height: 5\' 5"  (1.651 m)   Body mass index is 32.12 kg/m.  General appearance:  Normal affect, orientation and appearance. Skin: Grossly normal HEENT: Without gross lesions.  No cervical or supraclavicular adenopathy. Thyroid normal.  Lungs:  Clear without wheezing, rales or rhonchi Cardiac: RR, without RMG Abdominal:  Soft, nontender, without masses, guarding, rebound, organomegaly or hernia Breasts:  Examined lying and sitting without masses, retractions, discharge or axillary adenopathy. Pelvic:  Ext, BUS, Vagina: Normal with mild atrophic changes.  Mild uterine prolapse noted.  Cervix: Normal.  Pap smear done  Uterus: Anteverted, normal size, shape and contour, midline and mobile nontender   Adnexa: Without masses or tenderness    Anus and perineum: Normal   Rectovaginal: Normal sphincter tone without palpated masses or tenderness.    Assessment/Plan:  62 y.o. [id number] female for annual gynecologic exam.   1. Postmenopausal/atrophic genital changes.  No significant hot flushes, night sweats, vaginal dryness or any bleeding. 2. Mild uterine prolapse.  Asymptomatic to the patient.  Stable on serial exams. 3. Osteopenia.  DEXA 2018 T score -1.4 FRAX 6.5% / 0.5%.  Will have vitamin D level drawn at her primary physician's office at next blood draw.  Does supplement with 2000 units daily.  Will plan follow-up DEXA next year at  2-year interval. 4. Pap smear 2016.  Pap smear done today.  No history of abnormal Pap smears.  Will continue with Pap smears every 3 years per current screening guidelines. 5. Mammography 11/2016.  Follow-up for mammogram now.  Breast exam normal today. 6. Colonoscopy 2018.  Repeat at their recommended interval. 7. Health maintenance.  No routine lab work done as patient does this elsewhere.  Follow-up 1 year, sooner as needed.   [name], 9:33 AM 12/02/2017

## 2017-12-02 NOTE — Addendum Note (Signed)
Addended by: Nelva Nay on: 12/02/2017 09:49 AM   Modules accepted: Orders

## 2017-12-07 ENCOUNTER — Encounter: Payer: Self-pay | Admitting: Gynecology

## 2017-12-07 LAB — PAP IG W/ RFLX HPV ASCU

## 2017-12-07 LAB — HUMAN PAPILLOMAVIRUS, HIGH RISK: HPV DNA High Risk: NOT DETECTED

## 2018-12-07 ENCOUNTER — Encounter: Payer: BLUE CROSS/BLUE SHIELD | Admitting: Gynecology

## 2018-12-21 ENCOUNTER — Encounter: Payer: BLUE CROSS/BLUE SHIELD | Admitting: Gynecology

## 2019-01-05 ENCOUNTER — Other Ambulatory Visit: Payer: Self-pay

## 2019-01-06 ENCOUNTER — Encounter: Payer: Self-pay | Admitting: Gynecology

## 2019-01-06 ENCOUNTER — Ambulatory Visit (INDEPENDENT_AMBULATORY_CARE_PROVIDER_SITE_OTHER): Payer: BC Managed Care – PPO | Admitting: Gynecology

## 2019-01-06 VITALS — BP 118/76 | Ht 66.0 in | Wt 192.0 lb

## 2019-01-06 DIAGNOSIS — K439 Ventral hernia without obstruction or gangrene: Secondary | ICD-10-CM

## 2019-01-06 DIAGNOSIS — N952 Postmenopausal atrophic vaginitis: Secondary | ICD-10-CM | POA: Diagnosis not present

## 2019-01-06 DIAGNOSIS — Z01419 Encounter for gynecological examination (general) (routine) without abnormal findings: Secondary | ICD-10-CM | POA: Diagnosis not present

## 2019-01-06 DIAGNOSIS — R87610 Atypical squamous cells of undetermined significance on cytologic smear of cervix (ASC-US): Secondary | ICD-10-CM

## 2019-01-06 DIAGNOSIS — M8580 Other specified disorders of bone density and structure, unspecified site: Secondary | ICD-10-CM

## 2019-01-06 NOTE — Progress Notes (Signed)
    Deborah Sandoval [place] [birth year] [id number]        63 y.o.  [id number] for annual gynecologic exam.  Without gynecologic complaints.  She does note a bulging in her right upper anterior abdominal wall over the past year whenever she strains.  No pain with this.  Past medical history,surgical history, problem list, medications, allergies, family history and social history were all reviewed and documented as reviewed in the EPIC chart.  ROS:  Performed with pertinent positives and negatives included in the history, assessment and plan.   Additional significant findings : None   Exam: Deborah Sandoval assistant Vitals:   01/06/19 0800  BP: 118/76  Weight: 192 lb (87.1 kg)  Height: 5\' 6"  (1.676 m)   Body mass index is 30.99 kg/m.  General appearance:  Normal affect, orientation and appearance. Skin: Grossly normal HEENT: Without gross lesions.  No cervical or supraclavicular adenopathy. Thyroid normal.  Lungs:  Clear without wheezing, rales or rhonchi Cardiac: RR, without RMG Abdominal:  Soft, nontender, without masses, guarding, rebound, organomegaly.  Slight bulging right subcostal region just right of midline approximately 5 cm across consistent with a fascial weakness.  No underlying palpable abnormalities. Breasts:  Examined lying and sitting without masses, retractions, discharge or axillary adenopathy. Pelvic:  Ext, BUS, Vagina: Normal  Cervix: Normal.  Pap smear done  Uterus: Anteverted, normal size, shape and contour, midline and mobile nontender   Adnexa: Without masses or tenderness    Anus and perineum: Normal   Rectovaginal: Normal sphincter tone without palpated masses or tenderness.    Assessment/Plan:  63 y.o. [id number] female for annual gynecologic exam.   1. Postmenopausal.  No significant menopausal symptoms or any vaginal bleeding. 2. Fascial weakness right upper quadrant.  Asymptomatic to the patient other than just noticing a slight bulge.  Discussed hernia.   At this point is not overly bothersome to the patient and she will monitor.  If it worsens or she develops discomfort she will call and I will refer to general surgery. 3. ASCUS negative high risk HPV 2019.  Pap smear repeated today.  No other history of abnormal Pap smears previously. 4. Osteopenia.  DEXA 2018 T score -1.4 FRAX 6.5% / 0.5%.  Repeat DEXA now and patient will schedule in follow-up for this. 5. Colonoscopy 2018.  Repeat at their recommended interval. 6. Mammography 01/2018.  Continue with annual mammography next month.  Breast exam normal today. 7. Health maintenance.  No routine lab work done as patient does this elsewhere.  Follow-up 1 year, sooner as needed.   [name], 8:56 AM 01/06/2019

## 2019-01-06 NOTE — Addendum Note (Signed)
Addended by: Nelva Nay on: 01/06/2019 09:14 AM   Modules accepted: Orders

## 2019-01-06 NOTE — Patient Instructions (Signed)
Follow-up for the bone density as scheduled. 

## 2019-01-10 LAB — PAP IG W/ RFLX HPV ASCU

## 2019-01-31 ENCOUNTER — Encounter: Payer: Self-pay | Admitting: Gynecology

## 2019-03-22 ENCOUNTER — Encounter: Payer: Self-pay | Admitting: Gynecology

## 2019-05-17 ENCOUNTER — Other Ambulatory Visit: Payer: Self-pay

## 2019-05-18 ENCOUNTER — Ambulatory Visit (INDEPENDENT_AMBULATORY_CARE_PROVIDER_SITE_OTHER): Payer: BC Managed Care – PPO

## 2019-05-18 ENCOUNTER — Other Ambulatory Visit: Payer: Self-pay | Admitting: Gynecology

## 2019-05-18 DIAGNOSIS — M8588 Other specified disorders of bone density and structure, other site: Secondary | ICD-10-CM

## 2019-05-18 DIAGNOSIS — Z780 Asymptomatic menopausal state: Secondary | ICD-10-CM

## 2019-05-18 DIAGNOSIS — M8580 Other specified disorders of bone density and structure, unspecified site: Secondary | ICD-10-CM

## 2019-05-23 ENCOUNTER — Encounter: Payer: Self-pay | Admitting: Gynecology

## 2019-06-30 ENCOUNTER — Ambulatory Visit: Payer: 59 | Attending: Internal Medicine

## 2019-06-30 DIAGNOSIS — Z20822 Contact with and (suspected) exposure to covid-19: Secondary | ICD-10-CM | POA: Insufficient documentation

## 2019-07-02 LAB — NOVEL CORONAVIRUS, NAA: SARS-CoV-2, NAA: NOT DETECTED

## 2019-07-13 ENCOUNTER — Ambulatory Visit: Payer: Self-pay | Attending: Internal Medicine

## 2019-07-13 DIAGNOSIS — Z23 Encounter for immunization: Secondary | ICD-10-CM | POA: Insufficient documentation

## 2019-07-13 NOTE — Progress Notes (Signed)
   Covid-19 Vaccination Clinic  Name:  Deborah Sandoval    MRN: [id number] DOB: [birth year]  07/13/2019  [name] was observed post Covid-19 immunization for 15 minutes without incidence. She was provided with Vaccine Information Sheet and instruction to access the V-Safe system.   [name] was instructed to call 911 with any severe reactions post vaccine: Marland Kitchen Difficulty breathing  . Swelling of your face and throat  . A fast heartbeat  . A bad rash all over your body  . Dizziness and weakness    Immunizations Administered    Name Date Dose VIS Date Route   Pfizer COVID-19 Vaccine 07/13/2019  4:49 PM 0.3 mL 06/03/2019 Intramuscular   Manufacturer: [place]   Lot: [id number]   [place]: [id number]

## 2019-08-03 ENCOUNTER — Ambulatory Visit: Payer: Self-pay | Attending: Internal Medicine

## 2019-08-03 DIAGNOSIS — Z23 Encounter for immunization: Secondary | ICD-10-CM | POA: Insufficient documentation

## 2019-08-03 NOTE — Progress Notes (Signed)
   Covid-19 Vaccination Clinic  Name:  Dwayna Keene    MRN: [id number] DOB: May 18, 1956  08/03/2019  Ms. Giovannoni was observed post Covid-19 immunization for 15 minutes without incidence. She was provided with Vaccine Information Sheet and instruction to access the V-Safe system.   Ms. Nunnally was instructed to call 911 with any severe reactions post vaccine: Marland Kitchen Difficulty breathing  . Swelling of your face and throat  . A fast heartbeat  . A bad rash all over your body  . Dizziness and weakness    Immunizations Administered    Name Date Dose VIS Date Route   Pfizer COVID-19 Vaccine 08/03/2019  5:14 PM 0.3 mL 06/03/2019 Intramuscular   Manufacturer: [place]   Lot: [id number]   [place]: [id number]

## 2019-11-03 ENCOUNTER — Ambulatory Visit: Payer: Self-pay | Admitting: Physician Assistant

## 2020-01-10 ENCOUNTER — Encounter: Payer: Self-pay | Admitting: Nurse Practitioner

## 2020-01-10 ENCOUNTER — Ambulatory Visit (INDEPENDENT_AMBULATORY_CARE_PROVIDER_SITE_OTHER): Payer: 59 | Admitting: Nurse Practitioner

## 2020-01-10 ENCOUNTER — Other Ambulatory Visit: Payer: Self-pay

## 2020-01-10 VITALS — BP 118/76 | Ht 65.0 in | Wt 191.0 lb

## 2020-01-10 DIAGNOSIS — Z713 Dietary counseling and surveillance: Secondary | ICD-10-CM

## 2020-01-10 DIAGNOSIS — M8588 Other specified disorders of bone density and structure, other site: Secondary | ICD-10-CM | POA: Diagnosis not present

## 2020-01-10 DIAGNOSIS — E559 Vitamin D deficiency, unspecified: Secondary | ICD-10-CM | POA: Diagnosis not present

## 2020-01-10 DIAGNOSIS — Z01419 Encounter for gynecological examination (general) (routine) without abnormal findings: Secondary | ICD-10-CM

## 2020-01-10 DIAGNOSIS — Z13220 Encounter for screening for lipoid disorders: Secondary | ICD-10-CM

## 2020-01-10 DIAGNOSIS — Z1151 Encounter for screening for human papillomavirus (HPV): Secondary | ICD-10-CM

## 2020-01-10 NOTE — Progress Notes (Signed)
   Deborah Sandoval [facility] Jun 08, 1956 [id number]   History:  64 y.o. G3 P3 presents for annual exam without GYN complaints. Postmenopausal - no HRT, no bleeding. History of Vitamin D deficiency, osteopenia.  2019 pap ASCUS negative HPV, normal prior to this. Sister diagnosed with breast cancer at 55, another sister with colon cancer. Breast reduction many years ago.   Gynecologic History No LMP recorded. Patient is postmenopausal.   Contraception: post menopausal status Last Pap: 01/06/2019. Results were: normal Last mammogram: 01/31/2019. Results were: normal Last colonoscopy: 05/18/2017. Results were: normal Last Dexa: 05/18/2019. Results were: -1.2 at spine  Past medical history, past surgical history, family history and social history were all reviewed and documented in the EPIC chart.  ROS:  A ROS was performed and pertinent positives and negatives are included.  Exam:  Vitals:   01/10/20 0855  BP: 118/76  Weight: 191 lb (86.6 kg)  Height: 5\' 5"  (1.651 m)   Body mass index is 31.78 kg/m.  General appearance:  Normal Thyroid:  Symmetrical, normal in size, without palpable masses or nodularity. Respiratory  Auscultation:  Clear without wheezing or rhonchi Cardiovascular  Auscultation:  Regular rate, without rubs, murmurs or gallops  Edema/varicosities:  Not grossly evident Abdominal  Soft,nontender, without masses, guarding or rebound.  Liver/spleen:  No organomegaly noted  Hernia:  None appreciated  Skin  Inspection:  Grossly normal   Breasts: Examined lying and sitting. Breast reductions scars  Right: Without masses, retractions, discharge or axillary adenopathy.   Left: Without masses, retractions, discharge or axillary adenopathy. Gentitourinary   Inguinal/mons:  Normal without inguinal adenopathy  External genitalia:  Normal  BUS/Urethra/Skene's glands:  Normal  Vagina:  Normal  Cervix:  Normal, ectropion  Uterus:  Anteverted, normal in size, shape and  contour.  Midline and mobile  Adnexa/parametria:     Rt: Without masses or tenderness.   Lt: Without masses or tenderness.  Anus and perineum: Normal  Digital rectal exam: Normal sphincter tone without palpated masses or tenderness  Assessment/Plan:  64 y.o. G3 P3 for annual exam.   Well female exam with routine gynecological exam - Plan: CBC with Differential/Platelet, Comprehensive metabolic panel. Pap with HPV typing today. Education provided on SBEs, importance of preventative screenings, current guidelines, high calcium diet, vitamin D supplement, and multivitamin daily.   Osteopenia of spine - Plan: VITAMIN D 25 Hydroxy (Vit-D Deficiency, Fractures). t-score -1.2 at spine. Improved from -1.8 in 2018.   Vitamin D deficiency - Plan: VITAMIN D 25 Hydroxy (Vit-D Deficiency, Fractures)  Lipid screening - Plan: Lipid panel  Encounter for weight loss counseling - discussed weight loss management options to include WW/56-day diet/Optavia/low carb-low calorie and increasing exercise.   Follow up in 1 year for annual       [place], 9:30 AM 01/10/2020

## 2020-01-10 NOTE — Patient Instructions (Signed)

## 2020-01-10 NOTE — Addendum Note (Signed)
Addended by: Nelva Nay on: 01/10/2020 09:57 AM   Modules accepted: Orders

## 2020-01-11 ENCOUNTER — Other Ambulatory Visit: Payer: Self-pay

## 2020-01-11 DIAGNOSIS — E559 Vitamin D deficiency, unspecified: Secondary | ICD-10-CM

## 2020-01-11 LAB — CBC WITH DIFFERENTIAL/PLATELET
Absolute Monocytes: 731 cells/uL (ref 200–950)
Basophils Absolute: 62 cells/uL (ref 0–200)
Basophils Relative: 0.9 %
Eosinophils Absolute: 138 cells/uL (ref 15–500)
Eosinophils Relative: 2 %
HCT: 43.1 % (ref 35.0–45.0)
Hemoglobin: 14.7 g/dL (ref 11.7–15.5)
Lymphs Abs: 1815 cells/uL (ref 850–3900)
MCH: 31.5 pg (ref 27.0–33.0)
MCHC: 34.1 g/dL (ref 32.0–36.0)
MCV: 92.3 fL (ref 80.0–100.0)
MPV: 9.9 fL (ref 7.5–12.5)
Monocytes Relative: 10.6 %
Neutro Abs: 4154 cells/uL (ref 1500–7800)
Neutrophils Relative %: 60.2 %
Platelets: 368 10*3/uL (ref 140–400)
RBC: 4.67 10*6/uL (ref 3.80–5.10)
RDW: 12.4 % (ref 11.0–15.0)
Total Lymphocyte: 26.3 %
WBC: 6.9 10*3/uL (ref 3.8–10.8)

## 2020-01-11 LAB — LIPID PANEL
Cholesterol: 247 mg/dL — ABNORMAL HIGH (ref <200)
HDL: 53 mg/dL (ref >=50)
LDL Cholesterol (Calc): 159 mg/dL (calc) — ABNORMAL HIGH
Non-HDL Cholesterol (Calc): 194 mg/dL (calc) — ABNORMAL HIGH (ref <130)
Total CHOL/HDL Ratio: 4.7 (calc) (ref <5.0)
Triglycerides: 190 mg/dL — ABNORMAL HIGH (ref <150)

## 2020-01-11 LAB — COMPREHENSIVE METABOLIC PANEL
AG Ratio: 2 (calc) (ref 1.0–2.5)
ALT: 19 U/L (ref 6–29)
AST: 18 U/L (ref 10–35)
Albumin: 4.4 g/dL (ref 3.6–5.1)
Alkaline phosphatase (APISO): 63 U/L (ref 37–153)
BUN: 21 mg/dL (ref 7–25)
CO2: 25 mmol/L (ref 20–32)
Calcium: 9.9 mg/dL (ref 8.6–10.4)
Chloride: 104 mmol/L (ref 98–110)
Creat: 0.96 mg/dL (ref 0.50–0.99)
Globulin: 2.2 g/dL (calc) (ref 1.9–3.7)
Glucose, Bld: 83 mg/dL (ref 65–99)
Potassium: 4.4 mmol/L (ref 3.5–5.3)
Sodium: 139 mmol/L (ref 135–146)
Total Bilirubin: 0.7 mg/dL (ref 0.2–1.2)
Total Protein: 6.6 g/dL (ref 6.1–8.1)

## 2020-01-11 LAB — VITAMIN D 25 HYDROXY (VIT D DEFICIENCY, FRACTURES): Vit D, 25-Hydroxy: 24 ng/mL — ABNORMAL LOW (ref 30–100)

## 2020-01-11 MED ORDER — VITAMIN D (ERGOCALCIFEROL) 1.25 MG (50000 UNIT) PO CAPS
50000.0000 [IU] | ORAL_CAPSULE | ORAL | 0 refills | Status: DC
Start: 2020-01-11 — End: 2021-01-10

## 2020-01-12 LAB — PAP, TP IMAGING W/ HPV RNA, RFLX HPV TYPE 16,18/45: HPV DNA High Risk: NOT DETECTED

## 2020-02-08 ENCOUNTER — Encounter: Payer: Self-pay | Admitting: Nurse Practitioner

## 2020-07-01 ENCOUNTER — Ambulatory Visit
Admission: RE | Admit: 2020-07-01 | Discharge: 2020-07-01 | Disposition: A | Discharge status: Home / Self Care | Payer: 59 | Source: Ambulatory Visit | Attending: Family Medicine | Admitting: Family Medicine

## 2020-07-01 ENCOUNTER — Other Ambulatory Visit: Payer: Self-pay

## 2020-07-01 VITALS — BP 156/92 | HR 69 | Temp 98.5°F | Resp 16 | Ht 65.5 in | Wt 190.0 lb

## 2020-07-01 DIAGNOSIS — R059 Cough, unspecified: Secondary | ICD-10-CM | POA: Diagnosis present

## 2020-07-01 DIAGNOSIS — J069 Acute upper respiratory infection, unspecified: Secondary | ICD-10-CM

## 2020-07-01 DIAGNOSIS — Z9189 Other specified personal risk factors, not elsewhere classified: Secondary | ICD-10-CM | POA: Insufficient documentation

## 2020-07-01 DIAGNOSIS — J029 Acute pharyngitis, unspecified: Secondary | ICD-10-CM

## 2020-07-01 DIAGNOSIS — R0981 Nasal congestion: Secondary | ICD-10-CM | POA: Diagnosis not present

## 2020-07-01 DIAGNOSIS — J039 Acute tonsillitis, unspecified: Secondary | ICD-10-CM | POA: Insufficient documentation

## 2020-07-01 LAB — POCT RAPID STREP A (OFFICE): Rapid Strep A Screen: NEGATIVE

## 2020-07-01 MED ORDER — AZITHROMYCIN 250 MG PO TABS: 250.0000 mg | ORAL_TABLET | Freq: Every day | ORAL | 0 refills | Status: AC

## 2020-07-01 NOTE — Discharge Instructions (Signed)
You may mix liquid benadryl and maalox (10mL of each)  I have sent in azithromycin for you to take. Take 2 tablets today, then one tablet daily for the next 4 days.  Your COVID and Flu tests are pending.  You should self quarantine until the test results are back.    Take Tylenol or ibuprofen as needed for fever or discomfort.  Rest and keep yourself hydrated.    Follow-up with your primary care provider if your symptoms are not improving.

## 2020-07-01 NOTE — ED Triage Notes (Signed)
Sore throat x 10 days, left ear is starting to hurt and a slight cough non productive

## 2020-07-04 LAB — COVID-19, FLU A+B NAA
Influenza A, NAA: NOT DETECTED
Influenza B, NAA: NOT DETECTED
SARS-CoV-2, NAA: NOT DETECTED

## 2020-07-05 LAB — CULTURE, GROUP A STREP (THRC)

## 2020-07-06 NOTE — ED Provider Notes (Addendum)
[facility]   [id number] 07/01/20 Arrival Time: 2725   CC: COVID symptoms  SUBJECTIVE: History from: patient.  Deborah Sandoval is a 65 y.o. female who presents with sore throat, left otalgia, cough for the last 10 days. Denies sick exposure to COVID, flu or strep. Denies recent travel. Has negative history of Covid. Has completed Covid vaccines. Has not taken OTC medications for this. There are no aggravating or alleviating factors. Denies previous symptoms in the past. Denies fever, chills, fatigue, sinus pain, rhinorrhea, SOB, wheezing, chest pain, nausea, changes in bowel or bladder habits.    ROS: As per HPI.  All other pertinent ROS negative.     Past Medical History:  Diagnosis Date  . Adenoma    ON THYROID--REMOVED  . ASCUS of cervix with negative high risk HPV 11/2017  . Cancer ([place])    melanoma-leg  . Herniated disc   . History of shingles   . Osteopenia 04/2019   T score -1.1 FRAX 7% / 0.4%.  Stable from prior DEXA  . Thyroid disease    Nodule   Past Surgical History:  Procedure Laterality Date  . BREAST SURGERY     REDUCTION MAMMAPLASTY  . CESAREAN SECTION  1991  . LAPAROSCOPIC CHOLECYSTECTOMY    . Melanoma excised    . MOHS SURGERY    . THYROIDECTOMY, PARTIAL     No Known Allergies No current facility-administered medications on file prior to encounter.   Current Outpatient Medications on File Prior to Encounter  Medication Sig Dispense Refill  . Calcium Carbonate-Vitamin D (CALCIUM + D PO) Take by mouth.      . COLLAGEN PO Take by mouth.    . Magnesium 100 MG CAPS Take by mouth.    . Multiple Vitamins-Minerals (ZINC PO) Take by mouth.    . Vitamin D, Ergocalciferol, (DRISDOL) 1.25 MG (50000 UNIT) CAPS capsule Take 1 capsule (50,000 Units total) by mouth every 7 (seven) days. 7 capsule 0   Social History   Socioeconomic History  . Marital status: Married    Spouse name: Not on file  . Number of children: Not on file  . Years of  education: Not on file  . Highest education level: Not on file  Occupational History  . Not on file  Tobacco Use  . Smoking status: Never Smoker  . Smokeless tobacco: Never Used  Vaping Use  . Vaping Use: Never used  Substance and Sexual Activity  . Alcohol use: Yes    Alcohol/week: 0.0 standard drinks    Comment: Occas wine  . Drug use: No  . Sexual activity: Yes    Birth control/protection: Post-menopausal    Comment: 1st intercourse 66 yo-Fewer than 5 partners  Other Topics Concern  . Not on file  Social History Narrative  . Not on file   Social Determinants of Health   Financial Resource Strain: Not on file  Food Insecurity: Not on file  Transportation Needs: Not on file  Physical Activity: Not on file  Stress: Not on file  Social Connections: Not on file  Intimate Partner Violence: Not on file   Family History  Problem Relation Age of Onset  . Cancer Father        LUNG  . Cancer Mother        Sarcoma  . Breast cancer Sister        Age 73  . Diabetes Maternal Uncle   . Colon cancer Sister   . Parkinson's disease Brother  OBJECTIVE:  Vitals:   07/01/20 1550 07/01/20 1556  BP: (!) 156/92   Pulse: 69   Resp: 16   Temp: 98.5 F (36.9 C)   TempSrc: Oral   SpO2: 97%   Weight:  190 lb (86.2 kg)  Height:  5' 5.5" (1.664 m)     General appearance: alert; appears fatigued, but nontoxic; speaking in full sentences and tolerating own secretions HEENT: NCAT; Ears: EACs clear, TMs pearly gray; Eyes: PERRL.  EOM grossly intact. Sinuses: nontender; Nose: nares patent with clear rhinorrhea, Throat: oropharynx erythematous, cobblestoning present, tonsils non erythematous or enlarged, uvula midline  Neck: supple without LAD Lungs: unlabored respirations, symmetrical air entry; cough: absent; no respiratory distress; CTAB Heart: regular rate and rhythm.  Radial pulses 2+ symmetrical bilaterally Skin: warm and dry Psychological: alert and cooperative; normal mood  and affect  LABS:  No results found for this or any previous visit (from the past 24 hour(s)).   ASSESSMENT & PLAN:  1. Upper respiratory tract infection, unspecified type   2. At increased risk of exposure to COVID-19 virus   3. Acute tonsillitis, unspecified etiology   4. Nasal congestion   5. Cough   6. Sore throat     Meds ordered this encounter  Medications  . azithromycin (ZITHROMAX) 250 MG tablet    Sig: Take 1 tablet (250 mg total) by mouth daily. Take first 2 tablets together, then 1 every day until finished.    Dispense:  6 tablet    Refill:  0    Order Specific Question:   Supervising Provider    Answer:   Chase Picket [id number]   Prescribed azithromycin for URI given length of symptoms and clinical presentation Take as directed and to completion Rapid strep test was negative. Will culture and inform of positive results that require further treatment.  Continue supportive care at home COVID and flu testing ordered.  It will take between 2-3 days for test results. Someone will contact you regarding abnormal results.   Patient should remain in quarantine until they have received Covid results.  If negative you may resume normal activities (go back to work/school) while practicing hand hygiene, social distance, and mask wearing.  If positive, patient should remain in quarantine for at least 5 days from symptom onset AND greater than 72 hours after symptoms resolution (absence of fever without the use of fever-reducing medication and improvement in respiratory symptoms), whichever is longer Get plenty of rest and push fluids Use OTC zyrtec for nasal congestion, runny nose, and/or sore throat Use OTC flonase for nasal congestion and runny nose Use medications daily for symptom relief Use OTC medications like ibuprofen or tylenol as needed fever or pain Call or go to the ED if you have any new or worsening symptoms such as fever, worsening cough, shortness of breath,  chest tightness, chest pain, turning blue, changes in mental status.  Reviewed expectations re: course of current medical issues. Questions answered. Outlined signs and symptoms indicating need for more acute intervention. Patient verbalized understanding. After Visit Summary given.         [name] Congress, NP 07/06/20 1112    [name] Congress, NP 07/06/20 1123

## 2020-11-07 ENCOUNTER — Ambulatory Visit (INDEPENDENT_AMBULATORY_CARE_PROVIDER_SITE_OTHER): Payer: 59

## 2020-11-07 ENCOUNTER — Ambulatory Visit (INDEPENDENT_AMBULATORY_CARE_PROVIDER_SITE_OTHER): Payer: 59 | Admitting: Orthopedic Surgery

## 2020-11-07 ENCOUNTER — Telehealth: Payer: Self-pay

## 2020-11-07 ENCOUNTER — Encounter: Payer: Self-pay | Admitting: Orthopedic Surgery

## 2020-11-07 DIAGNOSIS — M25561 Pain in right knee: Secondary | ICD-10-CM

## 2020-11-07 DIAGNOSIS — M1711 Unilateral primary osteoarthritis, right knee: Secondary | ICD-10-CM

## 2020-11-07 NOTE — Progress Notes (Signed)
Office Visit Note   Patient: Deborah Sandoval           Date of Birth: 11-14-1955           MRN: [id number] Visit Date: 11/07/2020 Requested by: Angelina Pih, MD [place],  [place] [id number] PCP: Angelina Pih, MD  Subjective: Chief Complaint  Patient presents with  . Right Knee - Pain    HPI: Kaniya Trueheart is a 65 y.o. female who presents to the office complaining of right knee pain.  Patient notes pain for several months that initially began as periodic pain but has now become more consistent.  Denies any history of injury.  She initially had lateral sided pain but more recently has become medial sided.  She has giving way of the knee at times without any frank instability and denies any catching or locking symptoms.  She does note that occasionally the knee will swell up and keep her from walking.  Denies any history of gout, knee surgery, groin pain, low back pain or back surgery.  She reports pain is worse with walking and navigating stairs.  Pain occasionally wakes her up at night this is only begun in the last several days.  She has been using a knee brace and knee sleeve with some relief as well as taking Advil with good relief as well.  Denies any history of diabetes or smoking..                ROS: All systems reviewed are negative as they relate to the chief complaint within the history of present illness.  Patient denies fevers or chills.  Assessment & Plan: Visit Diagnoses:  1. Unilateral primary osteoarthritis, right knee   2. Right knee pain, unspecified chronicity     Plan: Patient is a 65 year old female who presents complaining of right knee pain.  She has several months of right knee pain without injury.  She does have effusion on exam today.  Right knee radiographs were taken and reviewed with the patient which revealed mild joint space narrowing of the medial compartment with no significant osteophyte formation.  No  other significant findings noted.  Discussed options available to patient.  Plan to aspirate and inject the right knee today.  20 cc was aspirated and patient tolerated the procedure well.  We will preapproved her for gel injection of the right knee and she will follow-up after the right knee cortisone injection wears off to try gel injection.  Patient agreed with plan.  Follow-up as needed after the cortisone injection wears off. This patient is diagnosed with osteoarthritis of the knee(s).    Radiographs show evidence of joint space narrowing, osteophytes, subchondral sclerosis and/or subchondral cysts.  This patient has knee pain which interferes with functional and activities of daily living.    This patient has experienced inadequate response, adverse effects and/or intolerance with conservative treatments such as acetaminophen, NSAIDS, topical creams, physical therapy or regular exercise, knee bracing and/or weight loss.   This patient has experienced inadequate response or has a contraindication to intra articular steroid injections for at least 3 months.   This patient is not scheduled to have a total knee replacement within 6 months of starting treatment with viscosupplementation.   Follow-Up Instructions: No follow-ups on file.   Orders:  Orders Placed This Encounter  Procedures  . XR KNEE 3 VIEW RIGHT   No orders of the defined types were placed in this encounter.  Procedures: Large Joint Inj: R knee on 11/07/2020 9:00 AM Indications: diagnostic evaluation, joint swelling and pain Details: 18 G 1.5 in needle, superolateral approach  Arthrogram: No  Medications: 5 mL lidocaine 1 %; 4 mL bupivacaine 0.25 %; 6 mg betamethasone acetate-betamethasone sodium phosphate 6 (3-3) MG/ML Outcome: tolerated well, no immediate complications Procedure, treatment alternatives, risks and benefits explained, specific risks discussed. Consent was given by the patient. Immediately prior to  procedure a time out was called to verify the correct patient, procedure, equipment, support staff and site/side marked as required. Patient was prepped and draped in the usual sterile fashion.       Clinical Data: No additional findings.  Objective: Vital Signs: There were no vitals taken for this visit.  Physical Exam:  Constitutional: Patient appears well-developed HEENT:  Head: Normocephalic Eyes:EOM are normal Neck: Normal range of motion Cardiovascular: Normal rate Pulmonary/chest: Effort normal Neurologic: Patient is alert Skin: Skin is warm Psychiatric: Patient has normal mood and affect  Ortho Exam: Ortho exam demonstrates right knee with 0 degrees extension and 120 degrees of knee flexion.  No calf tenderness.  Negative Homans' sign.  Moderate effusion is present.  Tenderness over the medial joint line.  No tenderness over the lateral joint line.  No pain with hip range of motion.  Able to perform straight leg raise with excellent quadricep strength.  Specialty Comments:  No specialty comments available.  Imaging: No results found.   PMFS History: Patient Active Problem List   Diagnosis Date Noted  . Osteopenia of spine 01/10/2020  . Thyroid disease   . Herniated disc   . History of shingles   . Osteopenia    Past Medical History:  Diagnosis Date  . Adenoma    ON THYROID--REMOVED  . ASCUS of cervix with negative high risk HPV 11/2017  . Cancer ([place])    melanoma-leg  . Herniated disc   . History of shingles   . Osteopenia 04/2019   T score -1.1 FRAX 7% / 0.4%.  Stable from prior DEXA  . Thyroid disease    Nodule    Family History  Problem Relation Age of Onset  . Cancer Father        LUNG  . Cancer Mother        Sarcoma  . Breast cancer Sister        Age 39  . Diabetes Maternal Uncle   . Colon cancer Sister   . Parkinson's disease Brother     Past Surgical History:  Procedure Laterality Date  . BREAST SURGERY     REDUCTION MAMMAPLASTY   . CESAREAN SECTION  1991  . LAPAROSCOPIC CHOLECYSTECTOMY    . Melanoma excised    . MOHS SURGERY    . THYROIDECTOMY, PARTIAL     Social History   Occupational History  . Not on file  Tobacco Use  . Smoking status: Never Smoker  . Smokeless tobacco: Never Used  Vaping Use  . Vaping Use: Never used  Substance and Sexual Activity  . Alcohol use: Yes    Alcohol/week: 0.0 standard drinks    Comment: Occas wine  . Drug use: No  . Sexual activity: Yes    Birth control/protection: Post-menopausal    Comment: 1st intercourse 65 yo-Fewer than 5 partners

## 2020-11-07 NOTE — Telephone Encounter (Signed)
Can we get patient approved for right knee gel injection? 

## 2020-11-09 MED ORDER — LIDOCAINE HCL 1 % IJ SOLN
5.0000 mL | INTRAMUSCULAR | Status: AC | PRN
Start: 2020-11-07 — End: 2020-11-07
  Administered 2020-11-07: 5 mL

## 2020-11-09 MED ORDER — BETAMETHASONE SOD PHOS & ACET 6 (3-3) MG/ML IJ SUSP
6.0000 mg | INTRAMUSCULAR | Status: AC | PRN
  Administered 2020-11-07: 6 mg via INTRA_ARTICULAR

## 2020-11-09 MED ORDER — BUPIVACAINE HCL 0.25 % IJ SOLN
4.0000 mL | INTRAMUSCULAR | Status: AC | PRN
Start: 2020-11-07 — End: 2020-11-07
  Administered 2020-11-07: 4 mL via INTRA_ARTICULAR

## 2020-11-09 NOTE — Telephone Encounter (Signed)
Noted  

## 2020-11-23 ENCOUNTER — Other Ambulatory Visit (HOSPITAL_COMMUNITY): Payer: Self-pay | Admitting: Family Medicine

## 2020-12-03 ENCOUNTER — Telehealth: Payer: Self-pay

## 2020-12-03 NOTE — Telephone Encounter (Signed)
Called and left a VM advising patient that [facility] does not cover gel injections. Advised her about TriVisc, which is a series and that it would cost $250.00 OOP and to give [place] a call back to discuss.

## 2020-12-04 ENCOUNTER — Telehealth: Payer: Self-pay

## 2020-12-04 NOTE — Telephone Encounter (Signed)
Talked with patient about TriVisc injections and she stated that she would like to proceed.  Submitted for TriVisc, right knee. Patient is aware that she will receive a call from [facility] to provide payment.

## 2020-12-14 ENCOUNTER — Telehealth: Payer: Self-pay

## 2020-12-14 NOTE — Telephone Encounter (Signed)
Received TriVisc injections for right knee. Patient Purchased

## 2020-12-28 ENCOUNTER — Encounter: Payer: Self-pay | Admitting: Surgical

## 2020-12-28 ENCOUNTER — Ambulatory Visit (INDEPENDENT_AMBULATORY_CARE_PROVIDER_SITE_OTHER): Payer: 59 | Admitting: Surgical

## 2020-12-28 DIAGNOSIS — M1711 Unilateral primary osteoarthritis, right knee: Secondary | ICD-10-CM

## 2020-12-28 MED ORDER — SODIUM HYALURONATE (VISCOSUP) 25 MG/2.5ML IX SOSY
25.0000 mg | PREFILLED_SYRINGE | INTRA_ARTICULAR | Status: AC | PRN
  Administered 2020-12-28: 25 mg via INTRA_ARTICULAR

## 2020-12-28 MED ORDER — LIDOCAINE HCL 1 % IJ SOLN
5.0000 mL | INTRAMUSCULAR | Status: AC | PRN
  Administered 2020-12-28: 5 mL

## 2020-12-28 NOTE — Progress Notes (Signed)
   Procedure Note  Patient: Deborah Sandoval             Date of Birth: 1956-03-21           MRN: [id number]             Visit Date: 12/28/2020  Procedures: Visit Diagnoses:  1. Unilateral primary osteoarthritis, right knee     Large Joint Inj: R knee on 12/28/2020 1:51 PM Indications: diagnostic evaluation, joint swelling and pain Details: 18 G 1.5 in needle, superolateral approach  Arthrogram: No  Medications: 5 mL lidocaine 1 %; 25 mg Sodium Hyaluronate 25 MG/2.5ML Aspirate: 30 mL yellow and blood-tinged Outcome: tolerated well, no immediate complications  Patient reports near 100% improvement of her right knee pain following the cortisone injection back in May.  This lasted for 2 to 3 weeks until she overworked herself doing some yard work and then her pain returned.  She does note intermittent swelling based on activity and primarily medial sided pain with occasional lateral sided pain.  No mechanical symptoms or instability. Procedure, treatment alternatives, risks and benefits explained, specific risks discussed. Consent was given by the patient. Immediately prior to procedure a time out was called to verify the correct patient, procedure, equipment, support staff and site/side marked as required. Patient was prepped and draped in the usual sterile fashion.

## 2021-01-01 ENCOUNTER — Other Ambulatory Visit: Payer: Self-pay

## 2021-01-01 ENCOUNTER — Ambulatory Visit (HOSPITAL_COMMUNITY)
Admission: RE | Admit: 2021-01-01 | Discharge: 2021-01-01 | Disposition: A | Discharge status: Home / Self Care | Payer: 59 | Source: Ambulatory Visit | Attending: Family Medicine | Admitting: Family Medicine

## 2021-01-01 DIAGNOSIS — Z136 Encounter for screening for cardiovascular disorders: Secondary | ICD-10-CM | POA: Insufficient documentation

## 2021-01-07 ENCOUNTER — Ambulatory Visit (INDEPENDENT_AMBULATORY_CARE_PROVIDER_SITE_OTHER): Payer: 59 | Admitting: Orthopedic Surgery

## 2021-01-07 DIAGNOSIS — M1711 Unilateral primary osteoarthritis, right knee: Secondary | ICD-10-CM

## 2021-01-08 ENCOUNTER — Encounter: Payer: Self-pay | Admitting: Orthopedic Surgery

## 2021-01-08 DIAGNOSIS — M1711 Unilateral primary osteoarthritis, right knee: Secondary | ICD-10-CM

## 2021-01-08 MED ORDER — LIDOCAINE HCL 1 % IJ SOLN
5.0000 mL | INTRAMUSCULAR | Status: AC | PRN
Start: 2021-01-08 — End: 2021-01-08
  Administered 2021-01-08: 5 mL

## 2021-01-08 NOTE — Progress Notes (Signed)
   Procedure Note  Patient: Deborah Sandoval             Date of Birth: 1956-04-14           MRN: [id number]             Visit Date: 01/07/2021  Procedures: Visit Diagnoses:  1. Unilateral primary osteoarthritis, right knee     Large Joint Inj: R knee on 01/08/2021 5:00 AM Indications: diagnostic evaluation, joint swelling and pain Details: 18 G 1.5 in needle, superolateral approach  Arthrogram: No  Medications: 5 mL lidocaine 1 % Outcome: tolerated well, no immediate complications Procedure, treatment alternatives, risks and benefits explained, specific risks discussed. Consent was given by the patient. Immediately prior to procedure a time out was called to verify the correct patient, procedure, equipment, support staff and site/side marked as required. Patient was prepped and draped in the usual sterile fashion.   Aspiration and injection of  tryvisc injection #2 performed

## 2021-01-09 NOTE — Progress Notes (Signed)
   Deborah Sandoval [facility] 1956/04/09 [id number]   History:  65 y.o. G3 P3 presents for annual exam without GYN complaints. Postmenopausal - no HRT, no bleeding. 2019 pap ASCUS negative HPV, otherwise normal history.  HTN, HLD managed by PCP. Osteopenia.   Gynecologic History No LMP recorded. Patient is postmenopausal.   Contraception: post menopausal status Last Pap: 01/10/2020 Results were: normal, 5-year repeat Last mammogram: 02/08/2020. Results were: normal Last colonoscopy: 05/18/2017. Results were: normal Last Dexa: 05/18/2019. Results were: -1.2 at spine  Past medical history, past surgical history, family history and social history were all reviewed and documented in the EPIC chart. Married. Husband has early onset dementia. 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 Sister diagnosed with breast cancer at age 12, another sister with history of colon cancer.   ROS:  A ROS was performed and pertinent positives and negatives are included.  Exam:  Vitals:   01/10/21 1135  BP: 122/82  Weight: 185 lb (83.9 kg)  Height: 5\' 5"  (1.651 m)    Body mass index is 30.79 kg/m.  General appearance:  Normal Thyroid:  Symmetrical, normal in size, without palpable masses or nodularity. Respiratory  Auscultation:  Clear without wheezing or rhonchi Cardiovascular  Auscultation:  Regular rate, without rubs, murmurs or gallops  Edema/varicosities:  Not grossly evident Abdominal  Soft,nontender, without masses, guarding or rebound.  Liver/spleen:  No organomegaly noted  Hernia:  None appreciated  Skin  Inspection:  Grossly normal   Breasts: Examined lying and sitting. Breast reductions scars  Right: Without masses, retractions, discharge or axillary adenopathy.   Left: Without masses, retractions, discharge or axillary adenopathy. Gentitourinary   Inguinal/mons:  Normal without inguinal adenopathy  External genitalia:   Normal  BUS/Urethra/Skene's glands:  Normal  Vagina:  Atrophic changes  Cervix:  Atrophic changes  Uterus:  Normal in size, shape and contour.  Midline and mobile  Adnexa/parametria:     Rt: Without masses or tenderness.   Lt: Without masses or tenderness.  Anus and perineum: Normal  Digital rectal exam: Normal sphincter tone without palpated masses or tenderness  Assessment/Plan:  65 y.o. G3 P3 for annual exam.   Well female exam with routine gynecological exam - Education provided on SBEs, importance of preventative screenings, current guidelines, high calcium diet, regular exercise, and multivitamin daily. Labs with PCP.   Postmenopausal - no HRT, no bleeding.   Osteopenia of spine - T-score -1.2 of spine. Dexa due at the end of the year and she plans to call back to schedule this then. Continue daily vitamin D supplement, high calcium diet and regular exercise.  Screening for cervical cancer - Normal Pap history.  Will repeat at 5-year interval per guidelines. Discussed the option to stop screening per guidelines after next pap and she is agreeable.  Screening for breast cancer - Normal mammogram history.  Continue annual screenings.  Normal breast exam today.  Screening for colon cancer - 2018 colonoscopy. Will repeat at GI's recommended interval.   Follow up in 1 year for annual       [place], 11:46 AM 01/10/2021

## 2021-01-10 ENCOUNTER — Ambulatory Visit (INDEPENDENT_AMBULATORY_CARE_PROVIDER_SITE_OTHER): Payer: 59 | Admitting: Nurse Practitioner

## 2021-01-10 ENCOUNTER — Encounter: Payer: Self-pay | Admitting: Nurse Practitioner

## 2021-01-10 ENCOUNTER — Other Ambulatory Visit: Payer: Self-pay

## 2021-01-10 VITALS — BP 122/82 | Ht 65.0 in | Wt 185.0 lb

## 2021-01-10 DIAGNOSIS — M8588 Other specified disorders of bone density and structure, other site: Secondary | ICD-10-CM | POA: Diagnosis not present

## 2021-01-10 DIAGNOSIS — Z780 Asymptomatic menopausal state: Secondary | ICD-10-CM | POA: Diagnosis not present

## 2021-01-10 DIAGNOSIS — Z01419 Encounter for gynecological examination (general) (routine) without abnormal findings: Secondary | ICD-10-CM

## 2021-01-10 DIAGNOSIS — E785 Hyperlipidemia, unspecified: Secondary | ICD-10-CM

## 2021-01-10 DIAGNOSIS — Z8639 Personal history of other endocrine, nutritional and metabolic disease: Secondary | ICD-10-CM

## 2021-01-14 ENCOUNTER — Ambulatory Visit (INDEPENDENT_AMBULATORY_CARE_PROVIDER_SITE_OTHER): Payer: 59 | Admitting: Orthopedic Surgery

## 2021-01-14 ENCOUNTER — Other Ambulatory Visit: Payer: Self-pay

## 2021-01-14 DIAGNOSIS — M1711 Unilateral primary osteoarthritis, right knee: Secondary | ICD-10-CM

## 2021-01-17 ENCOUNTER — Encounter: Payer: Self-pay | Admitting: Orthopedic Surgery

## 2021-01-17 DIAGNOSIS — M1711 Unilateral primary osteoarthritis, right knee: Secondary | ICD-10-CM | POA: Diagnosis not present

## 2021-01-17 MED ORDER — LIDOCAINE HCL 1 % IJ SOLN
5.0000 mL | INTRAMUSCULAR | Status: AC | PRN
  Administered 2021-01-17: 5 mL

## 2021-01-17 MED ORDER — SODIUM HYALURONATE (VISCOSUP) 25 MG/2.5ML IX SOSY
25.0000 mg | PREFILLED_SYRINGE | INTRA_ARTICULAR | Status: AC | PRN
  Administered 2021-01-17: 25 mg via INTRA_ARTICULAR

## 2021-01-17 NOTE — Progress Notes (Signed)
   Procedure Note  Patient: Deborah Sandoval             Date of Birth: Feb 16, 1956           MRN: [id number]             Visit Date: 01/14/2021  Procedures: Visit Diagnoses:  1. Unilateral primary osteoarthritis, right knee     Large Joint Inj: R knee on 01/17/2021 9:37 PM Indications: diagnostic evaluation, joint swelling and pain Details: 18 G 1.5 in needle, superolateral approach  Arthrogram: No  Medications: 5 mL lidocaine 1 %; 25 mg Sodium Hyaluronate 25 MG/2.5ML Outcome: tolerated well, no immediate complications Procedure, treatment alternatives, risks and benefits explained, specific risks discussed. Consent was given by the patient. Immediately prior to procedure a time out was called to verify the correct patient, procedure, equipment, support staff and site/side marked as required. Patient was prepped and draped in the usual sterile fashion.

## 2021-03-13 ENCOUNTER — Encounter: Payer: Self-pay | Admitting: Nurse Practitioner

## 2021-04-11 ENCOUNTER — Other Ambulatory Visit: Payer: Self-pay

## 2021-04-11 ENCOUNTER — Ambulatory Visit (INDEPENDENT_AMBULATORY_CARE_PROVIDER_SITE_OTHER): Payer: 59 | Admitting: Orthopaedic Surgery

## 2021-04-11 ENCOUNTER — Ambulatory Visit (HOSPITAL_BASED_OUTPATIENT_CLINIC_OR_DEPARTMENT_OTHER)
Admission: RE | Admit: 2021-04-11 | Discharge: 2021-04-11 | Disposition: A | Discharge status: Home / Self Care | Payer: 59 | Source: Ambulatory Visit | Attending: Orthopaedic Surgery | Admitting: Orthopaedic Surgery

## 2021-04-11 ENCOUNTER — Other Ambulatory Visit (HOSPITAL_BASED_OUTPATIENT_CLINIC_OR_DEPARTMENT_OTHER): Payer: Self-pay | Admitting: Orthopaedic Surgery

## 2021-04-11 DIAGNOSIS — M1711 Unilateral primary osteoarthritis, right knee: Secondary | ICD-10-CM | POA: Diagnosis present

## 2021-04-11 NOTE — Progress Notes (Signed)
Chief Complaint: Right knee pain     History of Present Illness:   Pain Score: 6/10  Deborah Sandoval is a 65 y.o. female with right knee pain that has been going on now for several years.  She has been diagnosed with osteoarthritis and treated by Dr. Marlou Sa.  She continues to have pain and swelling.  She has gotten a steroid injection which lasted for several weeks.  More recently she had a hyaluronic acid series of injections which worked for several months but wore off when she became more active.  She says that this is a dull pain and is worse with swelling in the knee.  She is very active and has multiple careers including being a Pharmacist, hospital as well as appraisals.  This is very difficult to do with the knee pain and osteoarthritis.    Surgical History:   None  PMH/PSH/Family History/Social History/Meds/Allergies:    Past Medical History:  Diagnosis Date   Adenoma    ON THYROID--REMOVED   ASCUS of cervix with negative high risk HPV 11/2017   Cancer ([place])    melanoma-leg   Elevated cholesterol    Herniated disc    History of shingles    Hypertension    Osteopenia 04/2019   T score -1.1 FRAX 7% / 0.4%.  Stable from prior DEXA   Thyroid disease    Nodule   Past Surgical History:  Procedure Laterality Date   BREAST SURGERY     REDUCTION MAMMAPLASTY   CESAREAN SECTION  1991   LAPAROSCOPIC CHOLECYSTECTOMY     Melanoma excised     MOHS SURGERY     THYROIDECTOMY, PARTIAL     Social History   Socioeconomic History   Marital status: Married    Spouse name: Not on file   Number of children: Not on file   Years of education: Not on file   Highest education level: Not on file  Occupational History   Not on file  Tobacco Use   Smoking status: Never   Smokeless tobacco: Never  Vaping Use   Vaping Use: Never used  Substance and Sexual Activity   Alcohol use: Not Currently   Drug use: No   Sexual activity: Yes    Birth  control/protection: Post-menopausal    Comment: 1st intercourse 65 yo-Fewer than 5 partners  Other Topics Concern   Not on file  Social History Narrative   Not on file   Social Determinants of Health   Financial Resource Strain: Not on file  Food Insecurity: Not on file  Transportation Needs: Not on file  Physical Activity: Not on file  Stress: Not on file  Social Connections: Not on file   Family History  Problem Relation Age of Onset   Cancer Father        LUNG   Cancer Mother        Sarcoma   Breast cancer Sister        Age 21   Diabetes Maternal Uncle    Colon cancer Sister    Parkinson's disease Brother    No Known Allergies Current Outpatient Medications  Medication Sig Dispense Refill   atorvastatin (LIPITOR) 10 MG tablet Take 10 mg by mouth daily.     azithromycin (ZITHROMAX) 250 MG tablet Take 1 tablet (250 mg total) by  mouth daily. Take first 2 tablets together, then 1 every day until finished. (Patient not taking: Reported on 01/10/2021) 6 tablet 0   Calcium Carbonate-Vitamin D (CALCIUM + D PO) Take by mouth.       COLLAGEN PO Take by mouth.     hydrochlorothiazide (HYDRODIURIL) 25 MG tablet Take 25 mg by mouth every morning.     Magnesium 100 MG CAPS Take by mouth.     VITAMIN D PO Take by mouth.     No current facility-administered medications for this visit.   No results found.  Review of Systems:   A ROS was performed including pertinent positives and negatives as documented in the HPI.  Physical Exam :   Constitutional: NAD and appears stated age Neurological: Alert and oriented Psych: Appropriate affect and cooperative There were no vitals taken for this visit.   Comprehensive Musculoskeletal Exam:      Musculoskeletal Exam  Gait Normal  Alignment Normal   Right Left  Inspection Normal Normal  Palpation    Tenderness Medial joint patellofemoral joint None  Crepitus Positive None  Effusion Moderate None  Range of Motion    Extension 0 0   Flexion 135 135  Strength    Extension 5/5 5/5  Flexion 5/5 5/5  Ligament Exam     Generalized Laxity No No  Lachman Negative Negative   Pivot Shift Negative Negative  Anterior Drawer Negative Negative  Valgus at 0 Negative Negative  Valgus at 20 Negative Negative  Varus at 0 0 0  Varus at 20   0 0  Posterior Drawer at 90 0 0  Vascular/Lymphatic Exam    Edema None None  Venous Stasis Changes No No  Distal Circulation Normal Normal  Neurologic    Light Touch Sensation Intact Intact  Special Tests:      Imaging:   Xray (4 views right knee): Right knee moderate osteoarthritis which is advanced predominantly in the medial and patellofemoral joint since more recent views and may   I personally reviewed and interpreted the radiographs.   Assessment:   65 year old female with mild to moderate right knee osteoarthritis.  She has trialed injections including steroid and gelsyn.  At this time I have discussed at length with her knee preservation versus total knee replacement.  At this time she cannot believe she is ready for total knee losing PRP.  I provided information on this.  She will be reading and getting more information on this and will let us know if she would like to consider.  Plan :    -She will consult [place] via MyChart should she want an additional information or to consider PRP versus knee arthroplasty with Dr. Marlou Sa     I personally saw and evaluated the patient, and participated in the management and treatment plan.  Vanetta Mulders, MD Attending Physician, Orthopedic Surgery  This document was dictated using Dragon voice recognition software. A reasonable attempt at proof reading has been made to minimize errors.

## 2021-08-23 ENCOUNTER — Telehealth: Payer: Self-pay | Admitting: Physical Medicine and Rehabilitation

## 2021-08-23 NOTE — Telephone Encounter (Signed)
Patient called needing to schedule an appointment with Dr. Ernestina Patches for her back. The number to contact patient is [phone] ?

## 2021-09-11 ENCOUNTER — Ambulatory Visit (INDEPENDENT_AMBULATORY_CARE_PROVIDER_SITE_OTHER): Payer: Medicare Other

## 2021-09-11 ENCOUNTER — Ambulatory Visit (INDEPENDENT_AMBULATORY_CARE_PROVIDER_SITE_OTHER): Payer: Medicare Other | Admitting: Orthopedic Surgery

## 2021-09-11 ENCOUNTER — Encounter: Payer: Self-pay | Admitting: Orthopedic Surgery

## 2021-09-11 ENCOUNTER — Other Ambulatory Visit: Payer: Self-pay

## 2021-09-11 DIAGNOSIS — M25512 Pain in left shoulder: Secondary | ICD-10-CM

## 2021-09-11 NOTE — Progress Notes (Signed)
? ?Office Visit Note ?  ?Patient: Deborah Sandoval           ?Date of Birth: 1956/06/13           ?MRN: [id number] ?Visit Date: 09/11/2021 ?Requested by: Glenis Smoker, MD ?[address] ?[place],  [place] [address] ?PCP: Glenis Smoker, MD ? ?Subjective: ?Chief Complaint  ?Patient presents with  ? Left Shoulder - Pain  ? ? ?HPI: Deborah Sandoval is a 66 y.o. female who presents to the office complaining of left shoulder pain.  Patient has had pain over the last month without injury that she localizes to the trapezial region of the left shoulder.  She has radiation into her neck and scapula on the left side.  No real radicular pain down the arm or any numbness or tingling.  Denies any decrease in range of motion in the shoulder or any weakness in the arm.  She has had similar episode of pain in the right shoulder back around 2015 that she had a cervical spine ESI by Dr. Ernestina Patches for this problem and it improved following the injection.  Pain has been waking her up at night in the left shoulder.  However, she has had significant improvement over the last week to the point where she feels she is 50% improved without any intervention.  She has continual improvement and has not plateaued yet.  She has been taking Tylenol and ibuprofen as needed.  No history of neck or shoulder surgery.  No history of diabetes. ? ?Patient also recently saw another provider for her knee arthritis.  She feels her knee pain is overall not terrible and fairly controlled at this point.  She does not wake with pain at night.  Pain does not limit her activity.  She occasionally will have some increased pain if she is more active but this is controlled with Tylenol.  She has had previous cortisone and gel injections which have helped her but she does not feel like she needs any intervention at this time..   ?             ?ROS: All systems reviewed are negative as they relate to the chief complaint within the  history of present illness.  Patient denies fevers or chills. ? ?Assessment & Plan: ?Visit Diagnoses:  ?1. Left shoulder pain, unspecified chronicity   ? ? ?Plan: Patient is a 66 year old female who presents for evaluation of left shoulder pain.  She has had left shoulder pain over the last month that has been waking her up at night and is similar to previous shoulder pain she had in the right shoulder that was ultimately due to cervical spine pathology.  She has had 50% improvement over the last week and does not want to consider any intervention at this time for the shoulder.  She does have some cervical spine degenerative changes on radiographs today.  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  Based on the distribution of her pain, suspect that this is more likely related to cervical spine pathology.  Recommended patient follow-up with the office as needed and she may call the office in the next couple weeks if pain returns and she would like to try a steroid Dosepak.  If she has worsening of her pain, could consider new MRI of the cervical spine.  Follow-up with the office as needed  for her shoulder or knee pain.  ? ?Follow-Up Instructions: No follow-ups on file.  ? ?Orders:  ?Orders Placed This Encounter  ?Procedures  ? XR Shoulder Left  ? XR Cervical Spine 2 or 3 views  ? ?No orders of the defined types were placed in this encounter. ? ? ? ? Procedures: ?No procedures performed ? ? ?Clinical Data: ?No additional findings. ? ?Objective: ?Vital Signs: There were no vitals taken for this visit. ? ?Physical Exam:  ?Constitutional: Patient appears well-developed ?HEENT:  ?Head: Normocephalic ?Eyes:EOM are normal ?Neck: Normal range of motion ?Cardiovascular: Normal rate ?Pulmonary/chest: Effort normal ?Neurologic: Patient is alert ?Skin: Skin is warm ?Psychiatric: Patient has normal mood and affect ? ?Ortho  Exam: Ortho exam demonstrates right knee with trace effusion.  3 degrees extension and 120 degrees of knee flexion.  Minimal tenderness over the medial joint line.  No significant tenderness over the lateral joint line.  Patient is able to perform straight leg raise. ? ?Left shoulder with 60 degrees external rotation, 85 degrees abduction, 180 degrees forward flexion.  This compared with the right shoulder with 60 degrees external rotation, 90 degrees abduction, 180 degrees forward flexion.  Excellent rotator cuff strength of both shoulders rated 5/5 of supra, infra, subscap with absolutely 0 pain elicited with strength testing.  No pain with range of motion of the shoulder.  5/5 motor strength of bilateral grip strength, finger abduction, pronation/supination, bicep, tricep, deltoid.  Mild pain with cervical spine range of motion.  No tenderness throughout the axial cervical spine.  Negative Spurling sign.  Negative Lhermitte sign. ? ?Specialty Comments:  ?No specialty comments available. ? ?Imaging: ?No results found. ? ? ?PMFS History: ?Patient Active Problem List  ? Diagnosis Date Noted  ? Osteopenia of spine 01/10/2020  ? Thyroid disease   ? Herniated disc   ? History of shingles   ? Osteopenia   ? ?Past Medical History:  ?Diagnosis Date  ? Adenoma   ? ON THYROID--REMOVED  ? ASCUS of cervix with negative high risk HPV 11/2017  ? Cancer [facility])   ? melanoma-leg  ? Elevated cholesterol   ? Herniated disc   ? History of shingles   ? Hypertension   ? Osteopenia 04/2019  ? T score -1.1 FRAX 7% / 0.4%.  Stable from prior DEXA  ? Thyroid disease   ? Nodule  ?  ?Family History  ?Problem Relation Age of Onset  ? Cancer Father   ?     LUNG  ? Cancer Mother   ?     Sarcoma  ? Breast cancer Sister   ?     Age 3  ? Diabetes Maternal Uncle   ? Colon cancer Sister   ? Parkinson's disease Brother   ?  ?Past Surgical History:  ?Procedure Laterality Date  ? BREAST SURGERY    ? REDUCTION MAMMAPLASTY  ? [place]  ?  LAPAROSCOPIC CHOLECYSTECTOMY    ? Melanoma excised    ? MOHS SURGERY    ? THYROIDECTOMY, PARTIAL    ? ?Social History  ? ?Occupational History  ? Not on file  ?Tobacco Use  ? Smoking status: Never  ? Smokeless tobacco: Never  ?Vaping Use  ? Vaping Use: Never used  ?Substance and Sexual Activity  ? Alcohol use: Not Currently  ? Drug use: No  ? Sexual activity: Yes  ?  Birth control/protection: Post-menopausal  ?  Comment: 1st intercourse 66 yo-Fewer than 5 partners  ? ? ? ? ?  ?

## 2021-09-14 ENCOUNTER — Encounter: Payer: Self-pay | Admitting: Orthopedic Surgery

## 2021-12-31 ENCOUNTER — Telehealth: Payer: Self-pay

## 2021-12-31 NOTE — Telephone Encounter (Signed)
VOB submitted for SynviscOne, right knee. BV pending.  

## 2022-01-10 ENCOUNTER — Other Ambulatory Visit: Payer: Self-pay

## 2022-01-10 DIAGNOSIS — M1711 Unilateral primary osteoarthritis, right knee: Secondary | ICD-10-CM

## 2022-01-13 ENCOUNTER — Ambulatory Visit: Payer: Medicare Other | Admitting: Orthopedic Surgery

## 2022-01-13 DIAGNOSIS — M1711 Unilateral primary osteoarthritis, right knee: Secondary | ICD-10-CM | POA: Diagnosis not present

## 2022-01-19 ENCOUNTER — Encounter: Payer: Self-pay | Admitting: Orthopedic Surgery

## 2022-01-19 MED ORDER — LIDOCAINE HCL 1 % IJ SOLN
5.0000 mL | INTRAMUSCULAR | Status: AC | PRN
  Administered 2022-01-13: 5 mL

## 2022-01-19 MED ORDER — HYLAN G-F 20 48 MG/6ML IX SOSY
48.0000 mg | PREFILLED_SYRINGE | INTRA_ARTICULAR | Status: AC | PRN
  Administered 2022-01-13: 48 mg via INTRA_ARTICULAR

## 2022-01-19 NOTE — Progress Notes (Signed)
   Procedure Note  Patient: Deborah Sandoval             Date of Birth: 1955/08/14           MRN: [id number]             Visit Date: 01/13/2022  Procedures: Visit Diagnoses:  1. Unilateral primary osteoarthritis, right knee     Large Joint Inj: R knee on 01/13/2022 7:22 AM Indications: pain, joint swelling and diagnostic evaluation Details: 18 G 1.5 in needle, superolateral approach  Arthrogram: No  Medications: 5 mL lidocaine 1 %; 48 mg Hylan 48 MG/6ML Outcome: tolerated well, no immediate complications  Lot D rS [id number] expires 2026-02 Product number [id number] Procedure, treatment alternatives, risks and benefits explained, specific risks discussed. Consent was given by the patient. Immediately prior to procedure a time out was called to verify the correct patient, procedure, equipment, support staff and site/side marked as required. Patient was prepped and draped in the usual sterile fashion.

## 2022-03-26 ENCOUNTER — Encounter: Payer: Self-pay | Admitting: Nurse Practitioner

## 2022-05-01 ENCOUNTER — Ambulatory Visit: Payer: Medicare Other | Admitting: Surgical

## 2022-05-01 ENCOUNTER — Encounter: Payer: Self-pay | Admitting: Orthopedic Surgery

## 2022-05-01 DIAGNOSIS — M1711 Unilateral primary osteoarthritis, right knee: Secondary | ICD-10-CM | POA: Diagnosis not present

## 2022-05-01 MED ORDER — LIDOCAINE HCL 1 % IJ SOLN
5.0000 mL | INTRAMUSCULAR | Status: AC | PRN
Start: 2022-05-01 — End: 2022-05-01
  Administered 2022-05-01: 5 mL

## 2022-05-01 MED ORDER — METHYLPREDNISOLONE ACETATE 40 MG/ML IJ SUSP
40.0000 mg | INTRAMUSCULAR | Status: AC | PRN
  Administered 2022-05-01: 40 mg via INTRA_ARTICULAR

## 2022-05-01 MED ORDER — BUPIVACAINE HCL 0.25 % IJ SOLN
4.0000 mL | INTRAMUSCULAR | Status: AC | PRN
  Administered 2022-05-01: 4 mL via INTRA_ARTICULAR

## 2022-05-01 NOTE — Progress Notes (Signed)
Office Visit Note   Patient: Deborah Sandoval           Date of Birth: 1955-09-10           MRN: [id number] Visit Date: 05/01/2022 Requested by: Glenis Smoker, MD [address] [place],  [place] [address] PCP: Glenis Smoker, MD  Subjective: Chief Complaint  Patient presents with   Right Knee - Pain    HPI: Deborah Sandoval is a 66 y.o. female who presents to the office reporting return of right knee pain.  Patient states that she had good relief of her right knee pain from the right knee gel injection that she had by Dr. Marlou Sa on 01/13/2022.  She has had return of right knee pain that she mostly localizes to the anterior medial aspect of the knee.  No new injury.  No groin pain.  No radicular pain.  No numbness or tingling.  She would like to try right knee cortisone injection..                ROS: All systems reviewed are negative as they relate to the chief complaint within the history of present illness.  Patient denies fevers or chills.  Assessment & Plan: Visit Diagnoses: No diagnosis found.  Plan: Patient is a 66 year old female who presents for evaluation of right knee pain.  She has history of right knee osteoarthritis.  Good relief from gel injection on 01/13/2022.  She would like to repeat this injection in the future but for now we will try cortisone injection as it has not been 6 months out from the gel injection yet.  18 cc was aspirated from the right knee prior to administration of cortisone injection.  She tolerated the procedure well.  Follow-up with the office as needed.  She will contact us in late January when it has been 6 months out from her injection and we will get her approved for gel injection at that point.  Patient agreed with plan.  Follow-Up Instructions: No follow-ups on file.   Orders:  No orders of the defined types were placed in this encounter.  No orders of the defined types were placed in this encounter.      Procedures: Large Joint Inj: R knee on 05/01/2022 6:33 PM Indications: diagnostic evaluation, joint swelling and pain Details: 18 G 1.5 in needle, superolateral approach  Arthrogram: No  Medications: 5 mL lidocaine 1 %; 40 mg methylPREDNISolone acetate 40 MG/ML; 4 mL bupivacaine 0.25 % Aspirate: 18 mL Outcome: tolerated well, no immediate complications Procedure, treatment alternatives, risks and benefits explained, specific risks discussed. Consent was given by the patient. Immediately prior to procedure a time out was called to verify the correct patient, procedure, equipment, support staff and site/side marked as required. Patient was prepped and draped in the usual sterile fashion.       Clinical Data: No additional findings.  Objective: Vital Signs: There were no vitals taken for this visit.  Physical Exam:  Constitutional: Patient appears well-developed HEENT:  Head: Normocephalic Eyes:EOM are normal Neck: Normal range of motion Cardiovascular: Normal rate Pulmonary/chest: Effort normal Neurologic: Patient is alert Skin: Skin is warm Psychiatric: Patient has normal mood and affect  Ortho Exam: Ortho exam demonstrates right knee with small effusion.  She has no cellulitis or skin changes noted.  No increased warmth.  She has tenderness over the medial and lateral joint lines.  She is able to perform straight leg raise without extensor lag.  She has about 3 degrees extension and 120 degrees of knee flexion.  No pain with hip range of motion.  Stable to anterior posterior drawer sign.  No calf tenderness.  Negative Homans' sign.  Specialty Comments:  No specialty comments available.  Imaging: No results found.   PMFS History: Patient Active Problem List   Diagnosis Date Noted   Osteopenia of spine 01/10/2020   Thyroid disease    Herniated disc    History of shingles    Osteopenia    Past Medical History:  Diagnosis Date   Adenoma    ON THYROID--REMOVED    ASCUS of cervix with negative high risk HPV 11/2017   Cancer ([place])    melanoma-leg   Elevated cholesterol    Herniated disc    History of shingles    Hypertension    Osteopenia 04/2019   T score -1.1 FRAX 7% / 0.4%.  Stable from prior DEXA   Thyroid disease    Nodule    Family History  Problem Relation Age of Onset   Cancer Father        LUNG   Cancer Mother        Sarcoma   Breast cancer Sister        Age 44   Diabetes Maternal Uncle    Colon cancer Sister    Parkinson's disease Brother     Past Surgical History:  Procedure Laterality Date   BREAST SURGERY     REDUCTION MAMMAPLASTY   CESAREAN SECTION  1991   LAPAROSCOPIC CHOLECYSTECTOMY     Melanoma excised     MOHS SURGERY     THYROIDECTOMY, PARTIAL     Social History   Occupational History   Not on file  Tobacco Use   Smoking status: Never   Smokeless tobacco: Never  Vaping Use   Vaping Use: Never used  Substance and Sexual Activity   Alcohol use: Not Currently   Drug use: No   Sexual activity: Yes    Birth control/protection: Post-menopausal    Comment: 1st intercourse 66 yo-Fewer than 5 partners

## 2022-06-15 IMAGING — CT CT CARDIAC CORONARY ARTERY CALCIUM SCORE
2 series · 15 of 20 positions shown, 17 images · non-contrast
Comparison: None.
COMPARISON: None.

Addendum:
EXAM:
OVER-READ INTERPRETATION  CT CHEST

The following report is an over-read performed by radiologist Dr.
Jamaal Frost [REDACTED] on 01/01/2021. This over-read
does not include interpretation of cardiac or coronary anatomy or
pathology. The calcium score interpretation by the cardiologist is
attached.
CLINICAL DATA: Cardiovascular Disease Risk stratification
Coronary Calcium Score
TECHNIQUE: A gated, non-contrast computed tomography scan of the heart was
performed using 3mm slice thickness. Axial images were analyzed on a
dedicated workstation. Calcium scoring of the coronary arteries was
performed using the Agatston method.

[Series 3: cascseq 2.0 b35f 70% · axial · 0.39mm/px · z∈[+1222,+1312]mm · 7 of 69 slices shown]
[im 8/69  vessel]
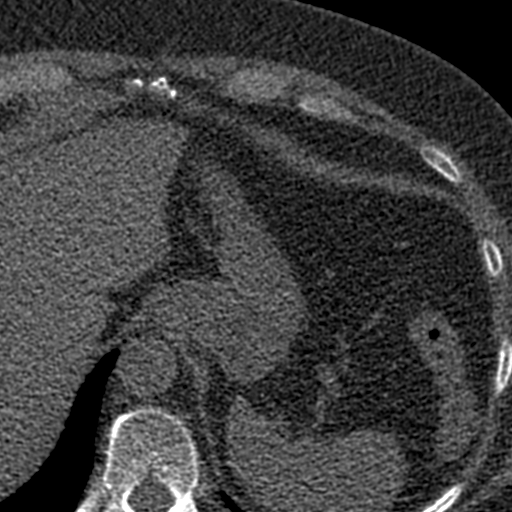
[im 16/69  vessel]
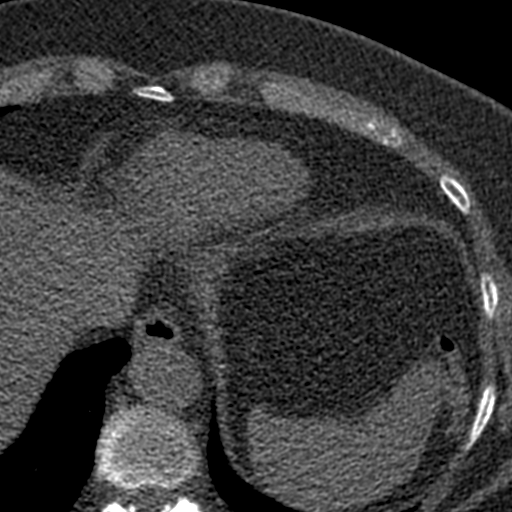
[im 23/69  vessel]
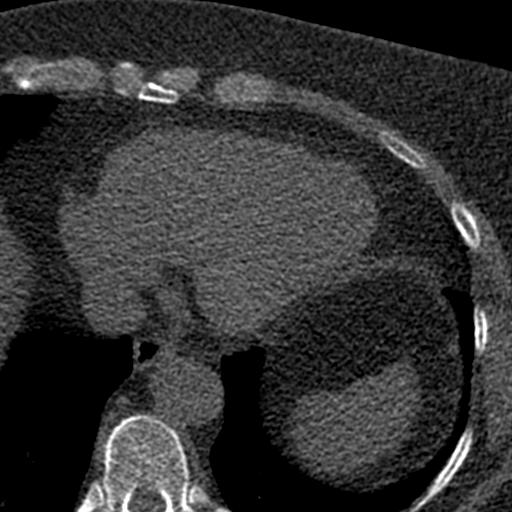
[im 31/69  vessel]
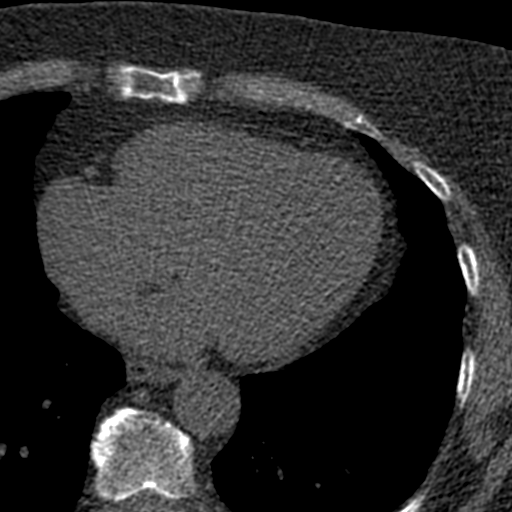
[im 38/69  vessel]
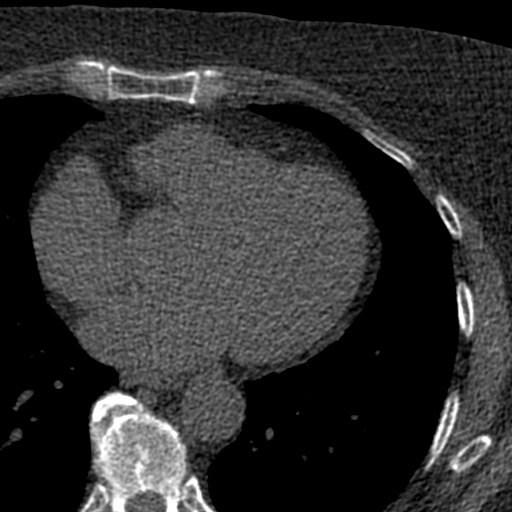
[im 46/69  vessel]
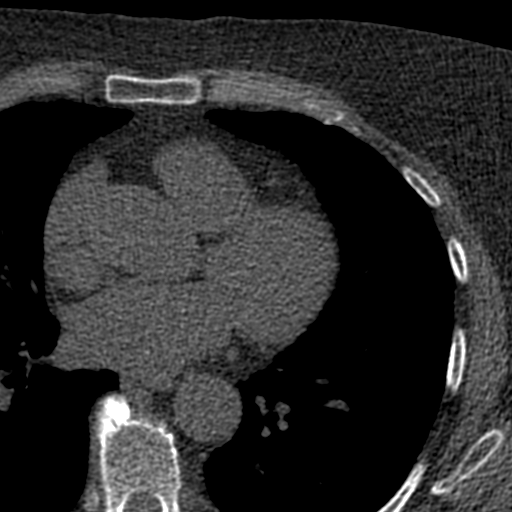
[im 53/69  vessel]
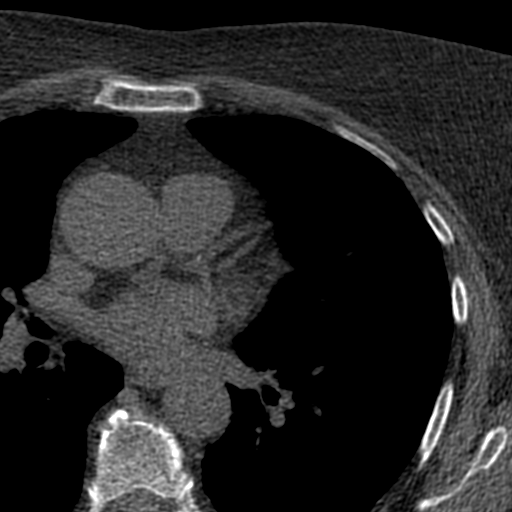

[Series 4: ax st full fov · axial · 0.72mm/px · z∈[+1222,+1328]mm · 8 of 69 slices shown, 10 images]
[im 8/69  vessel]
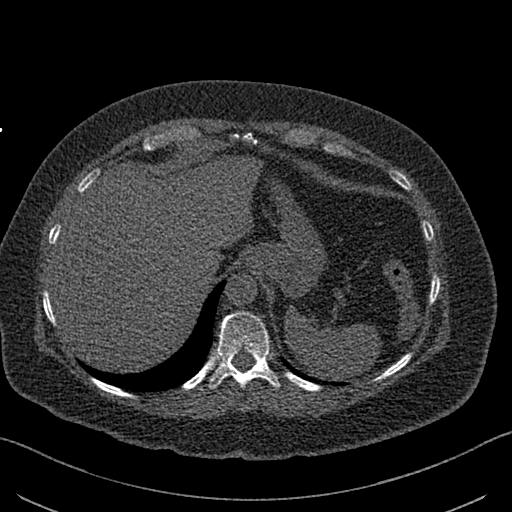
[im 8/69  lung]
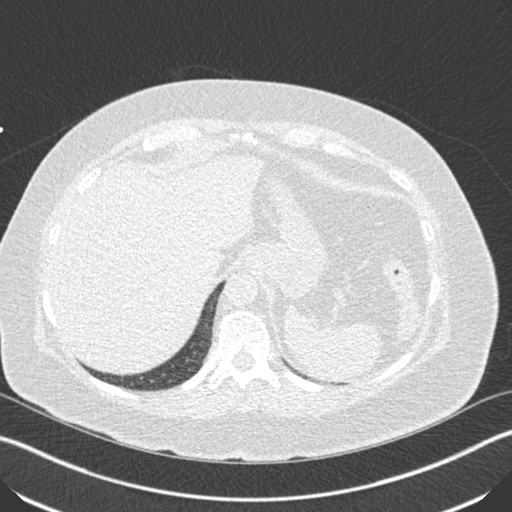
[im 16/69  vessel]
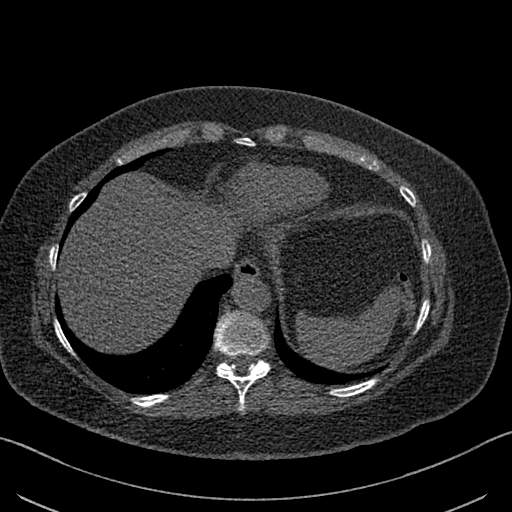
[im 23/69  vessel]
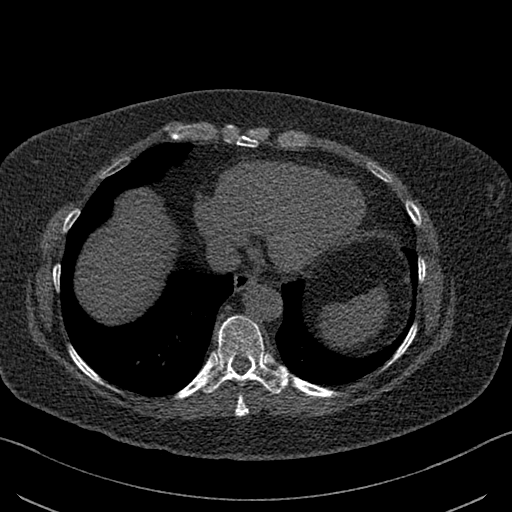
[im 31/69  vessel]
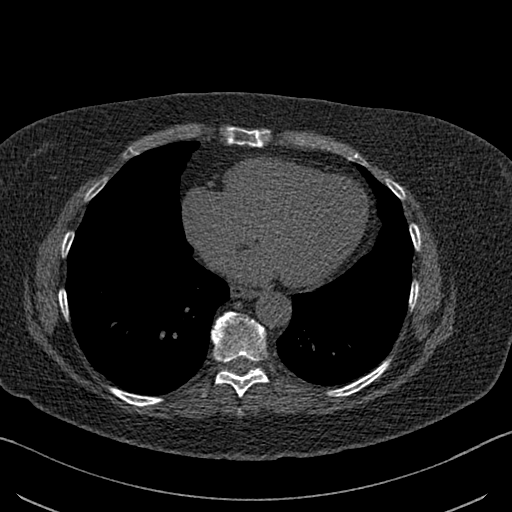
[im 38/69  vessel]
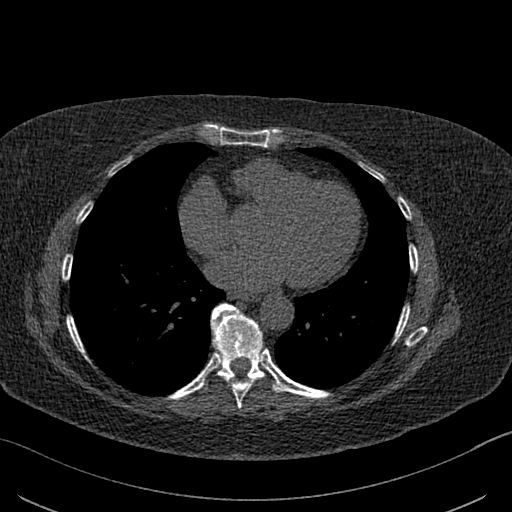
[im 38/69  lung]
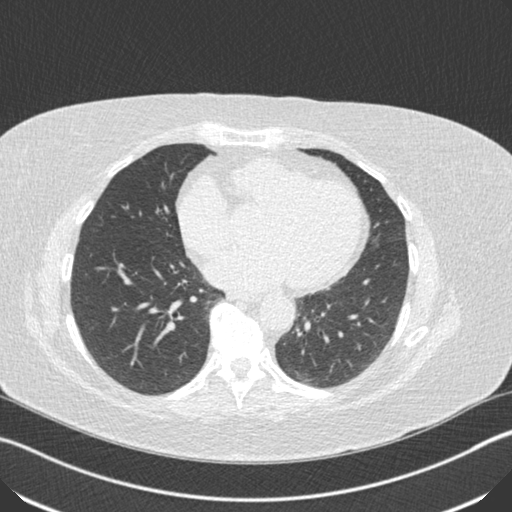
[im 46/69  vessel]
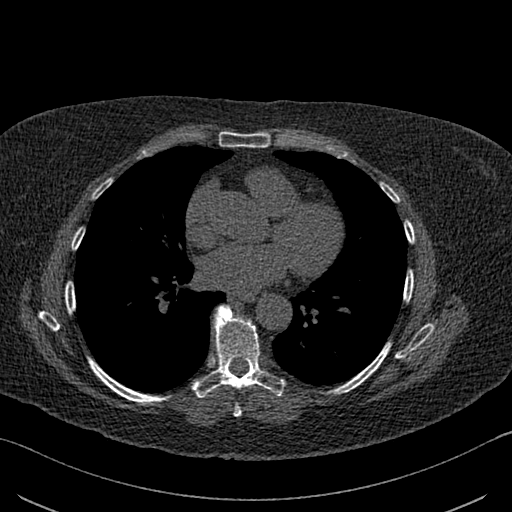
[im 53/69  vessel]
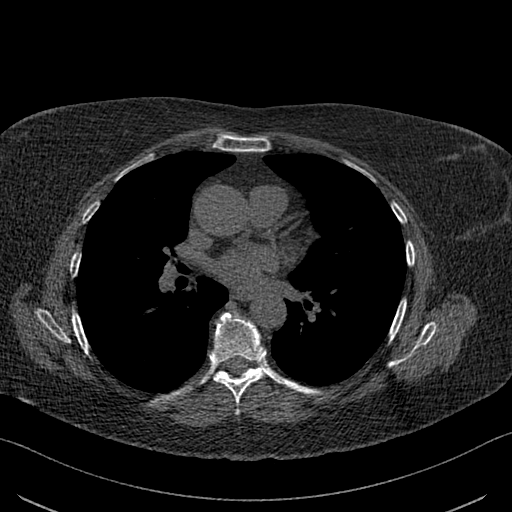
[im 61/69  vessel]
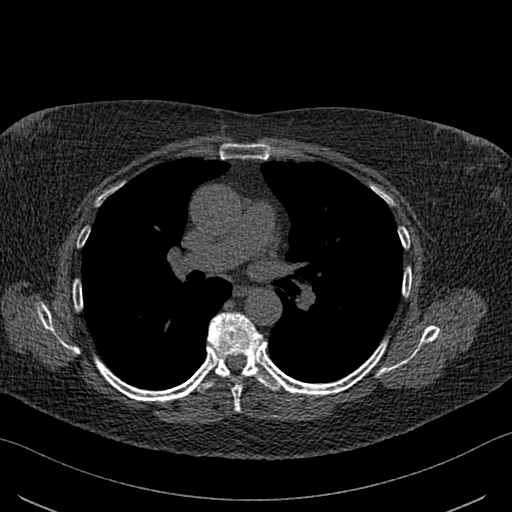

[15 of 20 positions shown; findings below may reference images not displayed]

FINDINGS: Vascular: Normal aortic caliber.  Tortuous thoracic aorta.

Mediastinum/Nodes: No imaged thoracic adenopathy.

Lungs/Pleura: No pleural fluid. 2 mm nodule on the right minor
fissure on [DATE] is most likely a subpleural lymph node.

Upper Abdomen: Normal imaged portions of the liver, spleen, stomach,
pancreas.

Musculoskeletal: No acute osseous abnormality.
IMPRESSION: No acute findings in the imaged extracardiac chest.
FINDINGS: Coronary arteries: Normal origins.

Coronary Calcium Score:

Left main:

Left anterior descending artery:

Left circumflex artery: 0

Right coronary artery: 0

Total:

Percentile: 73rd

Pericardium: Normal.

Ascending Aorta: Normal caliber.

Non-cardiac: See separate report from [REDACTED].
IMPRESSION: Coronary calcium score of 37.6. This was 73rd percentile for age-,
race-, and sex-matched controls.



If CAC=0, it is reasonable to withhold statin therapy and reassess
in 5 to 10 years, as long as higher risk conditions are absent
(diabetes mellitus, family history of premature CHD in first degree
relatives (males <55 years; females <65 years), cigarette smoking,
or LDL >=190 mg/dL).

If CAC is 1 to 99, it is reasonable to initiate statin therapy for
patients >=55 years of age.

If CAC is >=100 or >=75th percentile, it is reasonable to initiate
statin therapy at any age.

Cardiology referral should be considered for patients with CAC
scores >=400 or >=75th percentile.

*1099 AHA/ACC/AACVPR/AAPA/ABC/PRATHIMA/BU/BRISEYDA/Khalida/OREEDITSE/CHRISTILLE/ZUNA
Guideline on the Management of Blood Cholesterol: A Report of the
American College of Cardiology/American Heart Association Task Force
on Clinical Practice Guidelines. J Am Coll Cardiol.
5702;73(24):6736-6745.

*** End of Addendum ***
EXAM:
OVER-READ INTERPRETATION  CT CHEST

The following report is an over-read performed by radiologist Dr.
Jamaal Frost [REDACTED] on 01/01/2021. This over-read
does not include interpretation of cardiac or coronary anatomy or
pathology. The calcium score interpretation by the cardiologist is
attached.
FINDINGS: Vascular: Normal aortic caliber.  Tortuous thoracic aorta.

Mediastinum/Nodes: No imaged thoracic adenopathy.

Lungs/Pleura: No pleural fluid. 2 mm nodule on the right minor
fissure on [DATE] is most likely a subpleural lymph node.

Upper Abdomen: Normal imaged portions of the liver, spleen, stomach,
pancreas.

Musculoskeletal: No acute osseous abnormality.
IMPRESSION: No acute findings in the imaged extracardiac chest.

## 2022-06-27 ENCOUNTER — Ambulatory Visit: Payer: Medicare Other | Admitting: Surgical

## 2022-06-27 ENCOUNTER — Encounter: Payer: Self-pay | Admitting: Surgical

## 2022-06-27 DIAGNOSIS — M1711 Unilateral primary osteoarthritis, right knee: Secondary | ICD-10-CM | POA: Diagnosis not present

## 2022-06-27 MED ORDER — METHYLPREDNISOLONE ACETATE 40 MG/ML IJ SUSP
40.0000 mg | INTRAMUSCULAR | Status: AC | PRN
  Administered 2022-06-27: 40 mg via INTRA_ARTICULAR

## 2022-06-27 MED ORDER — LIDOCAINE HCL 1 % IJ SOLN
5.0000 mL | INTRAMUSCULAR | Status: AC | PRN
Start: 2022-06-27 — End: 2022-06-27
  Administered 2022-06-27: 5 mL

## 2022-06-27 MED ORDER — BUPIVACAINE HCL 0.25 % IJ SOLN
4.0000 mL | INTRAMUSCULAR | Status: AC | PRN
Start: 2022-06-27 — End: 2022-06-27
  Administered 2022-06-27: 4 mL via INTRA_ARTICULAR

## 2022-06-27 NOTE — Progress Notes (Signed)
   Procedure Note  Patient: Deborah Sandoval             Date of Birth: Aug 26, 1955           MRN: [id number]             Visit Date: 06/27/2022  Procedures: Visit Diagnoses:  1. Unilateral primary osteoarthritis, right knee   May go [employer] Inj: R knee on 06/27/2022 11:36 AM Indications: diagnostic evaluation, joint swelling and pain Details: 18 G 1.5 in needle, superolateral approach  Arthrogram: No  Medications: 5 mL lidocaine 1 %; 40 mg methylPREDNISolone acetate 40 MG/ML; 4 mL bupivacaine 0.25 % Outcome: tolerated well, no immediate complications Procedure, treatment alternatives, risks and benefits explained, specific risks discussed. Consent was given by the patient. Immediately prior to procedure a time out was called to verify the correct patient, procedure, equipment, support staff and site/side marked as required. Patient was prepped and draped in the usual sterile fashion.

## 2023-04-07 ENCOUNTER — Ambulatory Visit: Payer: Self-pay | Admitting: Nurse Practitioner

## 2023-04-27 ENCOUNTER — Ambulatory Visit: Payer: Self-pay | Admitting: Nurse Practitioner

## 2023-12-22 ENCOUNTER — Ambulatory Visit: Admitting: Surgical

## 2023-12-23 ENCOUNTER — Ambulatory Visit: Admitting: Physician Assistant

## 2023-12-23 ENCOUNTER — Encounter: Payer: Self-pay | Admitting: Physician Assistant

## 2023-12-23 DIAGNOSIS — M25561 Pain in right knee: Secondary | ICD-10-CM | POA: Diagnosis not present

## 2023-12-23 MED ORDER — LIDOCAINE HCL 1 % IJ SOLN
4.0000 mL | INTRAMUSCULAR | Status: AC | PRN
  Administered 2023-12-23: 4 mL

## 2023-12-23 MED ORDER — METHYLPREDNISOLONE ACETATE 40 MG/ML IJ SUSP
40.0000 mg | INTRAMUSCULAR | Status: AC | PRN
  Administered 2023-12-23: 40 mg via INTRA_ARTICULAR

## 2023-12-23 NOTE — Progress Notes (Signed)
 Office Visit Note   Patient: Deborah Sandoval           Date of Birth: 29-Dec-1955           MRN: [id number] Visit Date: 12/23/2023              Requested by: Chrystal Lamarr RAMAN, MD [phone] [facility] [address] [place],  [place] PCP: Chrystal Lamarr RAMAN, MD  No chief complaint on file.     HPI: Patient is a pleasant 68 year old woman who is a patient of Luke and Dr. Brion.  She periodically has steroid injections into her right knee with associated aspiration.  She has had no new injury.  She did states she has been doing things around her home may have made things a little more sore.  Comes in today requesting aspiration if necessary an injection  Assessment & Plan: Visit Diagnoses:  1. Right knee pain, unspecified chronicity     Plan: 13 cc of blood-tinged clear fluid was aspirated from her knee today she did well she may follow-up as needed  Follow-Up Instructions: Return if symptoms worsen or fail to improve.   Ortho Exam  Patient is alert, oriented, no adenopathy, well-dressed, normal affect, normal respiratory effort. Right knee no effusion no erythema compartments are soft and compressible she is neurovascular intact    Imaging: No results found. No images are attached to the encounter.  Labs: Lab Results  Component Value Date   HGBA1C 5.8 (H) 03/04/2012   REPTSTATUS 07/05/2020 FINAL 07/01/2020   CULT  07/01/2020    NO GROUP A STREP (S.PYOGENES) ISOLATED Performed at [facility], [address] [place], [place] [address]    LABORGA NO GROWTH 10/11/2013     Lab Results  Component Value Date   ALBUMIN 4.2 12/01/2016   ALBUMIN 4.3 11/01/2015   ALBUMIN 4.5 03/08/2013    No results found for: MG Lab Results  Component Value Date   VD25OH 24 (L) 01/10/2020   VD25OH 26 (L) 12/01/2016   VD25OH 35 02/14/2016    No results found for: PREALBUMIN    Latest Ref Rng & Units 01/10/2020    9:29 AM 12/01/2016    9:20 AM 11/01/2015    9:08  AM  CBC EXTENDED  WBC 3.8 - 10.8 Thousand/uL 6.9  5.8  5.3   RBC 3.80 - 5.10 Million/uL 4.67  4.77  4.33   Hemoglobin 11.7 - 15.5 g/dL 85.2  85.1  86.2   HCT 35.0 - 45.0 % 43.1  44.1  40.5   Platelets 140 - 400 Thousand/uL 368  388  351   NEUT# 1,500 - 7,800 cells/uL 4,154  2,958  2,703   Lymph# 850 - 3,900 cells/uL 1,815  2,030  1,855      There is no height or weight on file to calculate BMI.  Orders:  No orders of the defined types were placed in this encounter.  No orders of the defined types were placed in this encounter.    Procedures: Large Joint Inj: R knee on 12/23/2023 9:35 AM Indications: pain and diagnostic evaluation Details: 25 G 1.5 in needle, superolateral approach  Arthrogram: No  Medications: 40 mg methylPREDNISolone  acetate 40 MG/ML; 4 mL lidocaine  1 % Outcome: tolerated well, no immediate complications  After verbal consent was obtained superior lateral pouch was prepped with alcohol followed by Betadine.  4 cc of lidocaine  plain was injected.  After adequate analgesia the area was reprepped and 13 cc of blood-tinged  but not purulent fluid was aspirated followed by injection Band-Aid applied she ambulated from the clinic Procedure, treatment alternatives, risks and benefits explained, specific risks discussed. Consent was given by the patient.      Clinical Data: No additional findings.  ROS:  All other systems negative, except as noted in the HPI. Review of Systems  Objective: Vital Signs: There were no vitals taken for this visit.  Specialty Comments:  No specialty comments available.  PMFS History: Patient Active Problem List   Diagnosis Date Noted   Pain in right knee 12/23/2023   Osteopenia of spine 01/10/2020   Thyroid  disease    Herniated disc    History of shingles    Osteopenia    Past Medical History:  Diagnosis Date   Adenoma    ON THYROID --REMOVED   ASCUS of cervix with negative high risk HPV 11/2017   Cancer (HCC)     melanoma-leg   Elevated cholesterol    Herniated disc    History of shingles    Hypertension    Osteopenia 04/2019   T score -1.1 FRAX 7% / 0.4%.  Stable from prior DEXA   Thyroid  disease    Nodule    Family History  Problem Relation Age of Onset   Cancer Father        LUNG   Cancer Mother        Sarcoma   Breast cancer Sister        Age 25   Diabetes Maternal Uncle    Colon cancer Sister    Parkinson's disease Brother     Past Surgical History:  Procedure Laterality Date   BREAST SURGERY     REDUCTION MAMMAPLASTY   CESAREAN SECTION  1991   LAPAROSCOPIC CHOLECYSTECTOMY     Melanoma excised     MOHS SURGERY     THYROIDECTOMY, PARTIAL     Social History   Occupational History   Not on file  Tobacco Use   Smoking status: Never   Smokeless tobacco: Never  Vaping Use   Vaping status: Never Used  Substance and Sexual Activity   Alcohol use: Not Currently   Drug use: No   Sexual activity: Yes    Birth control/protection: Post-menopausal    Comment: 1st intercourse 68 yo-Fewer than 5 partners

## 2024-01-26 ENCOUNTER — Other Ambulatory Visit (HOSPITAL_BASED_OUTPATIENT_CLINIC_OR_DEPARTMENT_OTHER): Payer: Self-pay

## 2024-01-26 ENCOUNTER — Other Ambulatory Visit: Payer: Self-pay

## 2024-01-26 MED ORDER — HYDROCHLOROTHIAZIDE 25 MG PO TABS
25.0000 mg | ORAL_TABLET | Freq: Every day | ORAL | 3 refills | Status: AC
  Filled 2024-01-26: qty 90, 90d supply, fill #0

## 2024-01-26 MED ORDER — FLUOXETINE HCL 10 MG PO CAPS
10.0000 mg | ORAL_CAPSULE | Freq: Every day | ORAL | 3 refills | Status: AC
  Filled 2024-01-26: qty 90, 90d supply, fill #0

## 2024-01-26 MED ORDER — ATORVASTATIN CALCIUM 40 MG PO TABS
40.0000 mg | ORAL_TABLET | Freq: Every day | ORAL | 3 refills | Status: AC
  Filled 2024-01-26: qty 90, 90d supply, fill #0

## 2024-03-28 ENCOUNTER — Other Ambulatory Visit (HOSPITAL_BASED_OUTPATIENT_CLINIC_OR_DEPARTMENT_OTHER): Payer: Self-pay

## 2024-03-28 MED ORDER — CIPROFLOXACIN HCL 500 MG PO TABS
500.0000 mg | ORAL_TABLET | Freq: Two times a day (BID) | ORAL | 0 refills | Status: AC
  Filled 2024-03-28: qty 10, 5d supply, fill #0

## 2024-03-28 MED ORDER — FLUOXETINE HCL 20 MG PO CAPS
20.0000 mg | ORAL_CAPSULE | Freq: Every day | ORAL | 3 refills | Status: AC
  Filled 2024-03-28: qty 90, 90d supply, fill #0
  Filled 2024-07-06: qty 90, 90d supply, fill #1

## 2024-04-25 ENCOUNTER — Encounter: Payer: Self-pay | Admitting: Radiology

## 2024-07-06 ENCOUNTER — Other Ambulatory Visit (HOSPITAL_BASED_OUTPATIENT_CLINIC_OR_DEPARTMENT_OTHER): Payer: Self-pay
